# Patient Record
Sex: Male | Born: 1940
Health system: Southern US, Community
[De-identification: ages and names within clinical notes are randomized; demographics above are authoritative.]

## PROBLEM LIST (undated history)

## (undated) DIAGNOSIS — I209 Angina pectoris, unspecified: Secondary | ICD-10-CM

## (undated) DIAGNOSIS — J189 Pneumonia, unspecified organism: Secondary | ICD-10-CM

## (undated) DIAGNOSIS — F329 Major depressive disorder, single episode, unspecified: Secondary | ICD-10-CM

## (undated) DIAGNOSIS — F32A Depression, unspecified: Secondary | ICD-10-CM

## (undated) DIAGNOSIS — F419 Anxiety disorder, unspecified: Secondary | ICD-10-CM

## (undated) DIAGNOSIS — K219 Gastro-esophageal reflux disease without esophagitis: Secondary | ICD-10-CM

## (undated) DIAGNOSIS — I10 Essential (primary) hypertension: Secondary | ICD-10-CM

## (undated) DIAGNOSIS — E039 Hypothyroidism, unspecified: Secondary | ICD-10-CM

## (undated) DIAGNOSIS — M1990 Unspecified osteoarthritis, unspecified site: Secondary | ICD-10-CM

## (undated) DIAGNOSIS — R011 Cardiac murmur, unspecified: Secondary | ICD-10-CM

## (undated) HISTORY — PX: EYE SURGERY: SHX253

## (undated) HISTORY — PX: VASECTOMY: SHX75

---

## 2011-05-12 DIAGNOSIS — R5383 Other fatigue: Secondary | ICD-10-CM | POA: Diagnosis not present

## 2011-05-12 DIAGNOSIS — I10 Essential (primary) hypertension: Secondary | ICD-10-CM | POA: Diagnosis not present

## 2011-05-12 DIAGNOSIS — E039 Hypothyroidism, unspecified: Secondary | ICD-10-CM | POA: Diagnosis not present

## 2011-05-12 DIAGNOSIS — Z125 Encounter for screening for malignant neoplasm of prostate: Secondary | ICD-10-CM | POA: Diagnosis not present

## 2011-05-19 DIAGNOSIS — L723 Sebaceous cyst: Secondary | ICD-10-CM | POA: Diagnosis not present

## 2011-06-08 DIAGNOSIS — H2510 Age-related nuclear cataract, unspecified eye: Secondary | ICD-10-CM | POA: Diagnosis not present

## 2011-06-08 DIAGNOSIS — H40019 Open angle with borderline findings, low risk, unspecified eye: Secondary | ICD-10-CM | POA: Diagnosis not present

## 2011-06-08 DIAGNOSIS — Z9889 Other specified postprocedural states: Secondary | ICD-10-CM | POA: Diagnosis not present

## 2011-06-10 DIAGNOSIS — K219 Gastro-esophageal reflux disease without esophagitis: Secondary | ICD-10-CM | POA: Diagnosis not present

## 2011-06-10 DIAGNOSIS — I10 Essential (primary) hypertension: Secondary | ICD-10-CM | POA: Diagnosis not present

## 2011-06-10 DIAGNOSIS — F411 Generalized anxiety disorder: Secondary | ICD-10-CM | POA: Diagnosis not present

## 2011-06-10 DIAGNOSIS — E039 Hypothyroidism, unspecified: Secondary | ICD-10-CM | POA: Diagnosis not present

## 2011-06-13 DIAGNOSIS — I10 Essential (primary) hypertension: Secondary | ICD-10-CM | POA: Diagnosis not present

## 2011-06-13 DIAGNOSIS — Z6827 Body mass index (BMI) 27.0-27.9, adult: Secondary | ICD-10-CM | POA: Diagnosis not present

## 2011-06-13 DIAGNOSIS — L0291 Cutaneous abscess, unspecified: Secondary | ICD-10-CM | POA: Diagnosis not present

## 2011-06-13 DIAGNOSIS — L0390 Cellulitis, unspecified: Secondary | ICD-10-CM | POA: Diagnosis not present

## 2011-06-13 DIAGNOSIS — E039 Hypothyroidism, unspecified: Secondary | ICD-10-CM | POA: Diagnosis not present

## 2011-09-24 DIAGNOSIS — L570 Actinic keratosis: Secondary | ICD-10-CM | POA: Diagnosis not present

## 2011-09-24 DIAGNOSIS — D485 Neoplasm of uncertain behavior of skin: Secondary | ICD-10-CM | POA: Diagnosis not present

## 2011-09-24 DIAGNOSIS — D235 Other benign neoplasm of skin of trunk: Secondary | ICD-10-CM | POA: Diagnosis not present

## 2011-12-08 DIAGNOSIS — Z125 Encounter for screening for malignant neoplasm of prostate: Secondary | ICD-10-CM | POA: Diagnosis not present

## 2011-12-08 DIAGNOSIS — Z0000 Encounter for general adult medical examination without abnormal findings: Secondary | ICD-10-CM | POA: Diagnosis not present

## 2011-12-08 DIAGNOSIS — I10 Essential (primary) hypertension: Secondary | ICD-10-CM | POA: Diagnosis not present

## 2011-12-08 DIAGNOSIS — E039 Hypothyroidism, unspecified: Secondary | ICD-10-CM | POA: Diagnosis not present

## 2012-02-09 DIAGNOSIS — D1801 Hemangioma of skin and subcutaneous tissue: Secondary | ICD-10-CM | POA: Diagnosis not present

## 2012-02-09 DIAGNOSIS — L219 Seborrheic dermatitis, unspecified: Secondary | ICD-10-CM | POA: Diagnosis not present

## 2012-02-09 DIAGNOSIS — L821 Other seborrheic keratosis: Secondary | ICD-10-CM | POA: Diagnosis not present

## 2012-02-09 DIAGNOSIS — L910 Hypertrophic scar: Secondary | ICD-10-CM | POA: Diagnosis not present

## 2012-02-09 DIAGNOSIS — D239 Other benign neoplasm of skin, unspecified: Secondary | ICD-10-CM | POA: Diagnosis not present

## 2012-02-09 DIAGNOSIS — L570 Actinic keratosis: Secondary | ICD-10-CM | POA: Diagnosis not present

## 2012-02-25 DIAGNOSIS — H40019 Open angle with borderline findings, low risk, unspecified eye: Secondary | ICD-10-CM | POA: Diagnosis not present

## 2012-02-25 DIAGNOSIS — Z9889 Other specified postprocedural states: Secondary | ICD-10-CM | POA: Diagnosis not present

## 2012-02-25 DIAGNOSIS — Z961 Presence of intraocular lens: Secondary | ICD-10-CM | POA: Diagnosis not present

## 2012-02-25 DIAGNOSIS — H2510 Age-related nuclear cataract, unspecified eye: Secondary | ICD-10-CM | POA: Diagnosis not present

## 2012-03-08 DIAGNOSIS — E039 Hypothyroidism, unspecified: Secondary | ICD-10-CM | POA: Diagnosis not present

## 2012-03-08 DIAGNOSIS — I10 Essential (primary) hypertension: Secondary | ICD-10-CM | POA: Diagnosis not present

## 2012-03-08 DIAGNOSIS — Z6827 Body mass index (BMI) 27.0-27.9, adult: Secondary | ICD-10-CM | POA: Diagnosis not present

## 2012-03-08 DIAGNOSIS — J029 Acute pharyngitis, unspecified: Secondary | ICD-10-CM | POA: Diagnosis not present

## 2012-03-16 DIAGNOSIS — Z6827 Body mass index (BMI) 27.0-27.9, adult: Secondary | ICD-10-CM | POA: Diagnosis not present

## 2012-03-16 DIAGNOSIS — J392 Other diseases of pharynx: Secondary | ICD-10-CM | POA: Diagnosis not present

## 2012-03-16 DIAGNOSIS — F458 Other somatoform disorders: Secondary | ICD-10-CM | POA: Diagnosis not present

## 2012-03-16 DIAGNOSIS — K219 Gastro-esophageal reflux disease without esophagitis: Secondary | ICD-10-CM | POA: Diagnosis not present

## 2012-03-16 DIAGNOSIS — E039 Hypothyroidism, unspecified: Secondary | ICD-10-CM | POA: Diagnosis not present

## 2012-03-16 DIAGNOSIS — I10 Essential (primary) hypertension: Secondary | ICD-10-CM | POA: Diagnosis not present

## 2012-06-27 DIAGNOSIS — N529 Male erectile dysfunction, unspecified: Secondary | ICD-10-CM | POA: Diagnosis not present

## 2012-06-27 DIAGNOSIS — Z1331 Encounter for screening for depression: Secondary | ICD-10-CM | POA: Diagnosis not present

## 2012-06-27 DIAGNOSIS — F411 Generalized anxiety disorder: Secondary | ICD-10-CM | POA: Diagnosis not present

## 2012-06-27 DIAGNOSIS — Z9181 History of falling: Secondary | ICD-10-CM | POA: Diagnosis not present

## 2012-07-29 DIAGNOSIS — J309 Allergic rhinitis, unspecified: Secondary | ICD-10-CM | POA: Diagnosis not present

## 2012-07-29 DIAGNOSIS — E039 Hypothyroidism, unspecified: Secondary | ICD-10-CM | POA: Diagnosis not present

## 2012-07-29 DIAGNOSIS — Z23 Encounter for immunization: Secondary | ICD-10-CM | POA: Diagnosis not present

## 2012-07-29 DIAGNOSIS — I10 Essential (primary) hypertension: Secondary | ICD-10-CM | POA: Diagnosis not present

## 2012-07-29 DIAGNOSIS — Z6827 Body mass index (BMI) 27.0-27.9, adult: Secondary | ICD-10-CM | POA: Diagnosis not present

## 2012-10-17 DIAGNOSIS — H40019 Open angle with borderline findings, low risk, unspecified eye: Secondary | ICD-10-CM | POA: Diagnosis not present

## 2012-10-17 DIAGNOSIS — Z9889 Other specified postprocedural states: Secondary | ICD-10-CM | POA: Diagnosis not present

## 2012-10-17 DIAGNOSIS — H524 Presbyopia: Secondary | ICD-10-CM | POA: Diagnosis not present

## 2012-10-17 DIAGNOSIS — H2510 Age-related nuclear cataract, unspecified eye: Secondary | ICD-10-CM | POA: Diagnosis not present

## 2012-10-24 DIAGNOSIS — J309 Allergic rhinitis, unspecified: Secondary | ICD-10-CM | POA: Diagnosis not present

## 2012-10-24 DIAGNOSIS — Z6827 Body mass index (BMI) 27.0-27.9, adult: Secondary | ICD-10-CM | POA: Diagnosis not present

## 2012-10-24 DIAGNOSIS — J209 Acute bronchitis, unspecified: Secondary | ICD-10-CM | POA: Diagnosis not present

## 2012-10-24 DIAGNOSIS — K219 Gastro-esophageal reflux disease without esophagitis: Secondary | ICD-10-CM | POA: Diagnosis not present

## 2012-12-26 DIAGNOSIS — I10 Essential (primary) hypertension: Secondary | ICD-10-CM | POA: Diagnosis not present

## 2012-12-26 DIAGNOSIS — E039 Hypothyroidism, unspecified: Secondary | ICD-10-CM | POA: Diagnosis not present

## 2012-12-26 DIAGNOSIS — N529 Male erectile dysfunction, unspecified: Secondary | ICD-10-CM | POA: Diagnosis not present

## 2012-12-26 DIAGNOSIS — Z6827 Body mass index (BMI) 27.0-27.9, adult: Secondary | ICD-10-CM | POA: Diagnosis not present

## 2012-12-26 DIAGNOSIS — F411 Generalized anxiety disorder: Secondary | ICD-10-CM | POA: Diagnosis not present

## 2012-12-26 DIAGNOSIS — G4700 Insomnia, unspecified: Secondary | ICD-10-CM | POA: Diagnosis not present

## 2012-12-26 DIAGNOSIS — Z23 Encounter for immunization: Secondary | ICD-10-CM | POA: Diagnosis not present

## 2013-01-06 DIAGNOSIS — L981 Factitial dermatitis: Secondary | ICD-10-CM | POA: Diagnosis not present

## 2013-01-06 DIAGNOSIS — L570 Actinic keratosis: Secondary | ICD-10-CM | POA: Diagnosis not present

## 2013-01-06 DIAGNOSIS — L219 Seborrheic dermatitis, unspecified: Secondary | ICD-10-CM | POA: Diagnosis not present

## 2013-02-07 DIAGNOSIS — L821 Other seborrheic keratosis: Secondary | ICD-10-CM | POA: Diagnosis not present

## 2013-02-07 DIAGNOSIS — L578 Other skin changes due to chronic exposure to nonionizing radiation: Secondary | ICD-10-CM | POA: Diagnosis not present

## 2013-05-11 DIAGNOSIS — H33009 Unspecified retinal detachment with retinal break, unspecified eye: Secondary | ICD-10-CM | POA: Diagnosis not present

## 2013-05-11 DIAGNOSIS — Z9889 Other specified postprocedural states: Secondary | ICD-10-CM | POA: Diagnosis not present

## 2013-05-11 DIAGNOSIS — H2510 Age-related nuclear cataract, unspecified eye: Secondary | ICD-10-CM | POA: Diagnosis not present

## 2013-05-11 DIAGNOSIS — H40019 Open angle with borderline findings, low risk, unspecified eye: Secondary | ICD-10-CM | POA: Diagnosis not present

## 2013-05-17 DIAGNOSIS — L820 Inflamed seborrheic keratosis: Secondary | ICD-10-CM | POA: Diagnosis not present

## 2013-06-28 DIAGNOSIS — Z0000 Encounter for general adult medical examination without abnormal findings: Secondary | ICD-10-CM | POA: Diagnosis not present

## 2013-06-28 DIAGNOSIS — I10 Essential (primary) hypertension: Secondary | ICD-10-CM | POA: Diagnosis not present

## 2013-06-28 DIAGNOSIS — E039 Hypothyroidism, unspecified: Secondary | ICD-10-CM | POA: Diagnosis not present

## 2013-06-28 DIAGNOSIS — Z125 Encounter for screening for malignant neoplasm of prostate: Secondary | ICD-10-CM | POA: Diagnosis not present

## 2013-08-15 DIAGNOSIS — Z1211 Encounter for screening for malignant neoplasm of colon: Secondary | ICD-10-CM | POA: Diagnosis not present

## 2013-09-21 DIAGNOSIS — H33009 Unspecified retinal detachment with retinal break, unspecified eye: Secondary | ICD-10-CM | POA: Diagnosis not present

## 2013-09-21 DIAGNOSIS — H40019 Open angle with borderline findings, low risk, unspecified eye: Secondary | ICD-10-CM | POA: Diagnosis not present

## 2013-09-21 DIAGNOSIS — Z9889 Other specified postprocedural states: Secondary | ICD-10-CM | POA: Diagnosis not present

## 2013-09-21 DIAGNOSIS — H2510 Age-related nuclear cataract, unspecified eye: Secondary | ICD-10-CM | POA: Diagnosis not present

## 2013-10-23 DIAGNOSIS — IMO0002 Reserved for concepts with insufficient information to code with codable children: Secondary | ICD-10-CM | POA: Diagnosis not present

## 2013-10-23 DIAGNOSIS — M1710 Unilateral primary osteoarthritis, unspecified knee: Secondary | ICD-10-CM | POA: Diagnosis not present

## 2013-10-23 DIAGNOSIS — M25569 Pain in unspecified knee: Secondary | ICD-10-CM | POA: Diagnosis not present

## 2013-12-25 DIAGNOSIS — IMO0002 Reserved for concepts with insufficient information to code with codable children: Secondary | ICD-10-CM | POA: Diagnosis not present

## 2013-12-28 DIAGNOSIS — M1710 Unilateral primary osteoarthritis, unspecified knee: Secondary | ICD-10-CM | POA: Diagnosis not present

## 2013-12-28 DIAGNOSIS — M7120 Synovial cyst of popliteal space [Baker], unspecified knee: Secondary | ICD-10-CM | POA: Diagnosis not present

## 2013-12-28 DIAGNOSIS — M25469 Effusion, unspecified knee: Secondary | ICD-10-CM | POA: Diagnosis not present

## 2013-12-28 DIAGNOSIS — M8430XA Stress fracture, unspecified site, initial encounter for fracture: Secondary | ICD-10-CM | POA: Diagnosis not present

## 2013-12-28 DIAGNOSIS — M25569 Pain in unspecified knee: Secondary | ICD-10-CM | POA: Diagnosis not present

## 2013-12-28 DIAGNOSIS — IMO0002 Reserved for concepts with insufficient information to code with codable children: Secondary | ICD-10-CM | POA: Diagnosis not present

## 2013-12-28 DIAGNOSIS — M23329 Other meniscus derangements, posterior horn of medial meniscus, unspecified knee: Secondary | ICD-10-CM | POA: Diagnosis not present

## 2013-12-28 DIAGNOSIS — M84353A Stress fracture, unspecified femur, initial encounter for fracture: Secondary | ICD-10-CM | POA: Diagnosis not present

## 2014-01-01 DIAGNOSIS — IMO0002 Reserved for concepts with insufficient information to code with codable children: Secondary | ICD-10-CM | POA: Diagnosis not present

## 2014-01-10 DIAGNOSIS — E039 Hypothyroidism, unspecified: Secondary | ICD-10-CM | POA: Diagnosis not present

## 2014-01-10 DIAGNOSIS — J309 Allergic rhinitis, unspecified: Secondary | ICD-10-CM | POA: Diagnosis not present

## 2014-01-10 DIAGNOSIS — F411 Generalized anxiety disorder: Secondary | ICD-10-CM | POA: Diagnosis not present

## 2014-01-10 DIAGNOSIS — I10 Essential (primary) hypertension: Secondary | ICD-10-CM | POA: Diagnosis not present

## 2014-02-06 DIAGNOSIS — M1712 Unilateral primary osteoarthritis, left knee: Secondary | ICD-10-CM | POA: Diagnosis not present

## 2014-02-13 DIAGNOSIS — D1801 Hemangioma of skin and subcutaneous tissue: Secondary | ICD-10-CM | POA: Diagnosis not present

## 2014-02-13 DIAGNOSIS — D2272 Melanocytic nevi of left lower limb, including hip: Secondary | ICD-10-CM | POA: Diagnosis not present

## 2014-02-13 DIAGNOSIS — L812 Freckles: Secondary | ICD-10-CM | POA: Diagnosis not present

## 2014-02-13 DIAGNOSIS — D225 Melanocytic nevi of trunk: Secondary | ICD-10-CM | POA: Diagnosis not present

## 2014-02-13 DIAGNOSIS — I788 Other diseases of capillaries: Secondary | ICD-10-CM | POA: Diagnosis not present

## 2014-02-13 DIAGNOSIS — L570 Actinic keratosis: Secondary | ICD-10-CM | POA: Diagnosis not present

## 2014-02-13 DIAGNOSIS — D2372 Other benign neoplasm of skin of left lower limb, including hip: Secondary | ICD-10-CM | POA: Diagnosis not present

## 2014-02-13 DIAGNOSIS — L821 Other seborrheic keratosis: Secondary | ICD-10-CM | POA: Diagnosis not present

## 2014-02-20 DIAGNOSIS — Z23 Encounter for immunization: Secondary | ICD-10-CM | POA: Diagnosis not present

## 2014-02-27 DIAGNOSIS — S83242A Other tear of medial meniscus, current injury, left knee, initial encounter: Secondary | ICD-10-CM | POA: Diagnosis not present

## 2014-03-04 HISTORY — PX: KNEE ARTHROSCOPY: SUR90

## 2014-03-14 DIAGNOSIS — X58XXXA Exposure to other specified factors, initial encounter: Secondary | ICD-10-CM | POA: Diagnosis not present

## 2014-03-14 DIAGNOSIS — S83242A Other tear of medial meniscus, current injury, left knee, initial encounter: Secondary | ICD-10-CM | POA: Diagnosis not present

## 2014-03-14 DIAGNOSIS — M6752 Plica syndrome, left knee: Secondary | ICD-10-CM | POA: Diagnosis not present

## 2014-03-14 DIAGNOSIS — Y929 Unspecified place or not applicable: Secondary | ICD-10-CM | POA: Diagnosis not present

## 2014-03-14 DIAGNOSIS — M958 Other specified acquired deformities of musculoskeletal system: Secondary | ICD-10-CM | POA: Diagnosis not present

## 2014-03-14 DIAGNOSIS — M93262 Osteochondritis dissecans, left knee: Secondary | ICD-10-CM | POA: Diagnosis not present

## 2014-03-14 DIAGNOSIS — G8918 Other acute postprocedural pain: Secondary | ICD-10-CM | POA: Diagnosis not present

## 2014-03-14 DIAGNOSIS — S83232A Complex tear of medial meniscus, current injury, left knee, initial encounter: Secondary | ICD-10-CM | POA: Diagnosis not present

## 2014-03-26 DIAGNOSIS — H2511 Age-related nuclear cataract, right eye: Secondary | ICD-10-CM | POA: Diagnosis not present

## 2014-03-26 DIAGNOSIS — H5203 Hypermetropia, bilateral: Secondary | ICD-10-CM | POA: Diagnosis not present

## 2014-03-26 DIAGNOSIS — Z961 Presence of intraocular lens: Secondary | ICD-10-CM | POA: Diagnosis not present

## 2014-03-26 DIAGNOSIS — H40013 Open angle with borderline findings, low risk, bilateral: Secondary | ICD-10-CM | POA: Diagnosis not present

## 2014-03-26 DIAGNOSIS — Z9889 Other specified postprocedural states: Secondary | ICD-10-CM | POA: Diagnosis not present

## 2014-03-27 DIAGNOSIS — M6281 Muscle weakness (generalized): Secondary | ICD-10-CM | POA: Diagnosis not present

## 2014-03-27 DIAGNOSIS — M25562 Pain in left knee: Secondary | ICD-10-CM | POA: Diagnosis not present

## 2014-03-30 DIAGNOSIS — M25562 Pain in left knee: Secondary | ICD-10-CM | POA: Diagnosis not present

## 2014-03-30 DIAGNOSIS — M6281 Muscle weakness (generalized): Secondary | ICD-10-CM | POA: Diagnosis not present

## 2014-04-02 DIAGNOSIS — M6281 Muscle weakness (generalized): Secondary | ICD-10-CM | POA: Diagnosis not present

## 2014-04-02 DIAGNOSIS — M25562 Pain in left knee: Secondary | ICD-10-CM | POA: Diagnosis not present

## 2014-04-05 DIAGNOSIS — M6281 Muscle weakness (generalized): Secondary | ICD-10-CM | POA: Diagnosis not present

## 2014-04-05 DIAGNOSIS — M25562 Pain in left knee: Secondary | ICD-10-CM | POA: Diagnosis not present

## 2014-04-09 DIAGNOSIS — M6281 Muscle weakness (generalized): Secondary | ICD-10-CM | POA: Diagnosis not present

## 2014-04-09 DIAGNOSIS — M25562 Pain in left knee: Secondary | ICD-10-CM | POA: Diagnosis not present

## 2014-04-20 DIAGNOSIS — M6281 Muscle weakness (generalized): Secondary | ICD-10-CM | POA: Diagnosis not present

## 2014-04-20 DIAGNOSIS — M25562 Pain in left knee: Secondary | ICD-10-CM | POA: Diagnosis not present

## 2014-04-24 DIAGNOSIS — M25562 Pain in left knee: Secondary | ICD-10-CM | POA: Diagnosis not present

## 2014-04-24 DIAGNOSIS — M6281 Muscle weakness (generalized): Secondary | ICD-10-CM | POA: Diagnosis not present

## 2014-04-26 DIAGNOSIS — M6281 Muscle weakness (generalized): Secondary | ICD-10-CM | POA: Diagnosis not present

## 2014-04-26 DIAGNOSIS — M25562 Pain in left knee: Secondary | ICD-10-CM | POA: Diagnosis not present

## 2014-05-01 DIAGNOSIS — M6281 Muscle weakness (generalized): Secondary | ICD-10-CM | POA: Diagnosis not present

## 2014-05-01 DIAGNOSIS — M25562 Pain in left knee: Secondary | ICD-10-CM | POA: Diagnosis not present

## 2014-05-08 DIAGNOSIS — M6281 Muscle weakness (generalized): Secondary | ICD-10-CM | POA: Diagnosis not present

## 2014-05-08 DIAGNOSIS — M25562 Pain in left knee: Secondary | ICD-10-CM | POA: Diagnosis not present

## 2014-05-10 DIAGNOSIS — M25562 Pain in left knee: Secondary | ICD-10-CM | POA: Diagnosis not present

## 2014-05-10 DIAGNOSIS — M6281 Muscle weakness (generalized): Secondary | ICD-10-CM | POA: Diagnosis not present

## 2014-05-14 DIAGNOSIS — M6281 Muscle weakness (generalized): Secondary | ICD-10-CM | POA: Diagnosis not present

## 2014-05-14 DIAGNOSIS — M25562 Pain in left knee: Secondary | ICD-10-CM | POA: Diagnosis not present

## 2014-05-16 DIAGNOSIS — M25562 Pain in left knee: Secondary | ICD-10-CM | POA: Diagnosis not present

## 2014-05-16 DIAGNOSIS — M6281 Muscle weakness (generalized): Secondary | ICD-10-CM | POA: Diagnosis not present

## 2014-05-22 DIAGNOSIS — M6281 Muscle weakness (generalized): Secondary | ICD-10-CM | POA: Diagnosis not present

## 2014-05-22 DIAGNOSIS — M25562 Pain in left knee: Secondary | ICD-10-CM | POA: Diagnosis not present

## 2014-05-30 DIAGNOSIS — M6281 Muscle weakness (generalized): Secondary | ICD-10-CM | POA: Diagnosis not present

## 2014-05-30 DIAGNOSIS — M25562 Pain in left knee: Secondary | ICD-10-CM | POA: Diagnosis not present

## 2014-06-01 DIAGNOSIS — M25562 Pain in left knee: Secondary | ICD-10-CM | POA: Diagnosis not present

## 2014-06-01 DIAGNOSIS — M6281 Muscle weakness (generalized): Secondary | ICD-10-CM | POA: Diagnosis not present

## 2014-06-04 DIAGNOSIS — M25562 Pain in left knee: Secondary | ICD-10-CM | POA: Diagnosis not present

## 2014-06-04 DIAGNOSIS — M6281 Muscle weakness (generalized): Secondary | ICD-10-CM | POA: Diagnosis not present

## 2014-06-05 DIAGNOSIS — Z1211 Encounter for screening for malignant neoplasm of colon: Secondary | ICD-10-CM | POA: Diagnosis not present

## 2014-06-05 DIAGNOSIS — Z8371 Family history of colonic polyps: Secondary | ICD-10-CM | POA: Diagnosis not present

## 2014-06-11 DIAGNOSIS — M25562 Pain in left knee: Secondary | ICD-10-CM | POA: Diagnosis not present

## 2014-06-11 DIAGNOSIS — M6281 Muscle weakness (generalized): Secondary | ICD-10-CM | POA: Diagnosis not present

## 2014-06-15 DIAGNOSIS — M25562 Pain in left knee: Secondary | ICD-10-CM | POA: Diagnosis not present

## 2014-06-15 DIAGNOSIS — M6281 Muscle weakness (generalized): Secondary | ICD-10-CM | POA: Diagnosis not present

## 2014-06-18 DIAGNOSIS — M25562 Pain in left knee: Secondary | ICD-10-CM | POA: Diagnosis not present

## 2014-06-18 DIAGNOSIS — M6281 Muscle weakness (generalized): Secondary | ICD-10-CM | POA: Diagnosis not present

## 2014-06-22 DIAGNOSIS — M6281 Muscle weakness (generalized): Secondary | ICD-10-CM | POA: Diagnosis not present

## 2014-06-22 DIAGNOSIS — M25562 Pain in left knee: Secondary | ICD-10-CM | POA: Diagnosis not present

## 2014-06-27 DIAGNOSIS — M25562 Pain in left knee: Secondary | ICD-10-CM | POA: Diagnosis not present

## 2014-06-27 DIAGNOSIS — M6281 Muscle weakness (generalized): Secondary | ICD-10-CM | POA: Diagnosis not present

## 2014-06-29 DIAGNOSIS — M6281 Muscle weakness (generalized): Secondary | ICD-10-CM | POA: Diagnosis not present

## 2014-06-29 DIAGNOSIS — M25562 Pain in left knee: Secondary | ICD-10-CM | POA: Diagnosis not present

## 2014-07-02 DIAGNOSIS — M6281 Muscle weakness (generalized): Secondary | ICD-10-CM | POA: Diagnosis not present

## 2014-07-02 DIAGNOSIS — M25562 Pain in left knee: Secondary | ICD-10-CM | POA: Diagnosis not present

## 2014-07-05 DIAGNOSIS — M25562 Pain in left knee: Secondary | ICD-10-CM | POA: Diagnosis not present

## 2014-07-05 DIAGNOSIS — M6281 Muscle weakness (generalized): Secondary | ICD-10-CM | POA: Diagnosis not present

## 2014-07-09 DIAGNOSIS — M6281 Muscle weakness (generalized): Secondary | ICD-10-CM | POA: Diagnosis not present

## 2014-07-09 DIAGNOSIS — M25562 Pain in left knee: Secondary | ICD-10-CM | POA: Diagnosis not present

## 2014-07-11 DIAGNOSIS — Z1389 Encounter for screening for other disorder: Secondary | ICD-10-CM | POA: Diagnosis not present

## 2014-07-11 DIAGNOSIS — Z9181 History of falling: Secondary | ICD-10-CM | POA: Diagnosis not present

## 2014-07-11 DIAGNOSIS — F419 Anxiety disorder, unspecified: Secondary | ICD-10-CM | POA: Diagnosis not present

## 2014-07-11 DIAGNOSIS — E039 Hypothyroidism, unspecified: Secondary | ICD-10-CM | POA: Diagnosis not present

## 2014-07-11 DIAGNOSIS — J309 Allergic rhinitis, unspecified: Secondary | ICD-10-CM | POA: Diagnosis not present

## 2014-07-11 DIAGNOSIS — G4700 Insomnia, unspecified: Secondary | ICD-10-CM | POA: Diagnosis not present

## 2014-07-11 DIAGNOSIS — N529 Male erectile dysfunction, unspecified: Secondary | ICD-10-CM | POA: Diagnosis not present

## 2014-07-11 DIAGNOSIS — N400 Enlarged prostate without lower urinary tract symptoms: Secondary | ICD-10-CM | POA: Diagnosis not present

## 2014-07-11 DIAGNOSIS — I10 Essential (primary) hypertension: Secondary | ICD-10-CM | POA: Diagnosis not present

## 2014-07-11 DIAGNOSIS — Z6828 Body mass index (BMI) 28.0-28.9, adult: Secondary | ICD-10-CM | POA: Diagnosis not present

## 2014-07-12 DIAGNOSIS — M6281 Muscle weakness (generalized): Secondary | ICD-10-CM | POA: Diagnosis not present

## 2014-07-12 DIAGNOSIS — M25562 Pain in left knee: Secondary | ICD-10-CM | POA: Diagnosis not present

## 2014-07-20 DIAGNOSIS — M6281 Muscle weakness (generalized): Secondary | ICD-10-CM | POA: Diagnosis not present

## 2014-07-20 DIAGNOSIS — M25562 Pain in left knee: Secondary | ICD-10-CM | POA: Diagnosis not present

## 2014-08-06 DIAGNOSIS — F419 Anxiety disorder, unspecified: Secondary | ICD-10-CM | POA: Diagnosis not present

## 2014-08-06 DIAGNOSIS — I10 Essential (primary) hypertension: Secondary | ICD-10-CM | POA: Diagnosis not present

## 2014-08-06 DIAGNOSIS — N529 Male erectile dysfunction, unspecified: Secondary | ICD-10-CM | POA: Diagnosis not present

## 2014-08-06 DIAGNOSIS — E039 Hypothyroidism, unspecified: Secondary | ICD-10-CM | POA: Diagnosis not present

## 2014-08-06 DIAGNOSIS — Z800 Family history of malignant neoplasm of digestive organs: Secondary | ICD-10-CM | POA: Diagnosis not present

## 2014-08-06 DIAGNOSIS — D124 Benign neoplasm of descending colon: Secondary | ICD-10-CM | POA: Diagnosis not present

## 2014-08-06 DIAGNOSIS — K5730 Diverticulosis of large intestine without perforation or abscess without bleeding: Secondary | ICD-10-CM | POA: Diagnosis not present

## 2014-08-06 DIAGNOSIS — Z86010 Personal history of colonic polyps: Secondary | ICD-10-CM | POA: Diagnosis not present

## 2014-08-06 DIAGNOSIS — K648 Other hemorrhoids: Secondary | ICD-10-CM | POA: Diagnosis not present

## 2014-08-06 DIAGNOSIS — K635 Polyp of colon: Secondary | ICD-10-CM | POA: Diagnosis not present

## 2014-08-06 DIAGNOSIS — Z8371 Family history of colonic polyps: Secondary | ICD-10-CM | POA: Diagnosis not present

## 2014-08-06 DIAGNOSIS — K589 Irritable bowel syndrome without diarrhea: Secondary | ICD-10-CM | POA: Diagnosis not present

## 2014-08-06 DIAGNOSIS — Z1211 Encounter for screening for malignant neoplasm of colon: Secondary | ICD-10-CM | POA: Diagnosis not present

## 2014-08-06 DIAGNOSIS — N400 Enlarged prostate without lower urinary tract symptoms: Secondary | ICD-10-CM | POA: Diagnosis not present

## 2014-08-06 DIAGNOSIS — K219 Gastro-esophageal reflux disease without esophagitis: Secondary | ICD-10-CM | POA: Diagnosis not present

## 2014-08-06 DIAGNOSIS — G4700 Insomnia, unspecified: Secondary | ICD-10-CM | POA: Diagnosis not present

## 2014-08-09 DIAGNOSIS — Z9889 Other specified postprocedural states: Secondary | ICD-10-CM | POA: Diagnosis not present

## 2014-08-21 DIAGNOSIS — R222 Localized swelling, mass and lump, trunk: Secondary | ICD-10-CM | POA: Diagnosis not present

## 2014-08-21 DIAGNOSIS — L723 Sebaceous cyst: Secondary | ICD-10-CM | POA: Diagnosis not present

## 2014-08-21 DIAGNOSIS — M7989 Other specified soft tissue disorders: Secondary | ICD-10-CM | POA: Diagnosis not present

## 2014-08-23 DIAGNOSIS — H40013 Open angle with borderline findings, low risk, bilateral: Secondary | ICD-10-CM | POA: Diagnosis not present

## 2014-09-03 DIAGNOSIS — Z09 Encounter for follow-up examination after completed treatment for conditions other than malignant neoplasm: Secondary | ICD-10-CM | POA: Diagnosis not present

## 2014-09-03 DIAGNOSIS — L723 Sebaceous cyst: Secondary | ICD-10-CM | POA: Diagnosis not present

## 2014-09-13 DIAGNOSIS — M7741 Metatarsalgia, right foot: Secondary | ICD-10-CM | POA: Diagnosis not present

## 2014-09-13 DIAGNOSIS — L603 Nail dystrophy: Secondary | ICD-10-CM | POA: Diagnosis not present

## 2014-09-13 DIAGNOSIS — M719 Bursopathy, unspecified: Secondary | ICD-10-CM | POA: Diagnosis not present

## 2014-10-02 DIAGNOSIS — M9903 Segmental and somatic dysfunction of lumbar region: Secondary | ICD-10-CM | POA: Diagnosis not present

## 2014-10-02 DIAGNOSIS — M9905 Segmental and somatic dysfunction of pelvic region: Secondary | ICD-10-CM | POA: Diagnosis not present

## 2014-10-02 DIAGNOSIS — M5416 Radiculopathy, lumbar region: Secondary | ICD-10-CM | POA: Diagnosis not present

## 2014-10-02 DIAGNOSIS — M9902 Segmental and somatic dysfunction of thoracic region: Secondary | ICD-10-CM | POA: Diagnosis not present

## 2014-10-04 DIAGNOSIS — M9903 Segmental and somatic dysfunction of lumbar region: Secondary | ICD-10-CM | POA: Diagnosis not present

## 2014-10-04 DIAGNOSIS — M9905 Segmental and somatic dysfunction of pelvic region: Secondary | ICD-10-CM | POA: Diagnosis not present

## 2014-10-04 DIAGNOSIS — M7741 Metatarsalgia, right foot: Secondary | ICD-10-CM | POA: Diagnosis not present

## 2014-10-04 DIAGNOSIS — M5416 Radiculopathy, lumbar region: Secondary | ICD-10-CM | POA: Diagnosis not present

## 2014-10-04 DIAGNOSIS — M9902 Segmental and somatic dysfunction of thoracic region: Secondary | ICD-10-CM | POA: Diagnosis not present

## 2014-10-04 DIAGNOSIS — M719 Bursopathy, unspecified: Secondary | ICD-10-CM | POA: Diagnosis not present

## 2014-10-11 DIAGNOSIS — M9903 Segmental and somatic dysfunction of lumbar region: Secondary | ICD-10-CM | POA: Diagnosis not present

## 2014-10-11 DIAGNOSIS — M9902 Segmental and somatic dysfunction of thoracic region: Secondary | ICD-10-CM | POA: Diagnosis not present

## 2014-10-11 DIAGNOSIS — M5416 Radiculopathy, lumbar region: Secondary | ICD-10-CM | POA: Diagnosis not present

## 2014-10-11 DIAGNOSIS — M9905 Segmental and somatic dysfunction of pelvic region: Secondary | ICD-10-CM | POA: Diagnosis not present

## 2014-10-25 DIAGNOSIS — Z9889 Other specified postprocedural states: Secondary | ICD-10-CM | POA: Diagnosis not present

## 2014-10-25 DIAGNOSIS — H2511 Age-related nuclear cataract, right eye: Secondary | ICD-10-CM | POA: Diagnosis not present

## 2014-10-25 DIAGNOSIS — H33001 Unspecified retinal detachment with retinal break, right eye: Secondary | ICD-10-CM | POA: Diagnosis not present

## 2014-10-25 DIAGNOSIS — H40013 Open angle with borderline findings, low risk, bilateral: Secondary | ICD-10-CM | POA: Diagnosis not present

## 2014-11-13 DIAGNOSIS — M7741 Metatarsalgia, right foot: Secondary | ICD-10-CM | POA: Diagnosis not present

## 2014-11-19 DIAGNOSIS — Z6828 Body mass index (BMI) 28.0-28.9, adult: Secondary | ICD-10-CM | POA: Diagnosis not present

## 2014-11-19 DIAGNOSIS — I10 Essential (primary) hypertension: Secondary | ICD-10-CM | POA: Diagnosis not present

## 2014-11-19 DIAGNOSIS — F419 Anxiety disorder, unspecified: Secondary | ICD-10-CM | POA: Diagnosis not present

## 2014-11-19 DIAGNOSIS — N400 Enlarged prostate without lower urinary tract symptoms: Secondary | ICD-10-CM | POA: Diagnosis not present

## 2014-11-19 DIAGNOSIS — J309 Allergic rhinitis, unspecified: Secondary | ICD-10-CM | POA: Diagnosis not present

## 2014-11-19 DIAGNOSIS — K219 Gastro-esophageal reflux disease without esophagitis: Secondary | ICD-10-CM | POA: Diagnosis not present

## 2014-11-19 DIAGNOSIS — N529 Male erectile dysfunction, unspecified: Secondary | ICD-10-CM | POA: Diagnosis not present

## 2014-11-19 DIAGNOSIS — G4700 Insomnia, unspecified: Secondary | ICD-10-CM | POA: Diagnosis not present

## 2014-11-22 DIAGNOSIS — M1712 Unilateral primary osteoarthritis, left knee: Secondary | ICD-10-CM | POA: Diagnosis not present

## 2014-11-22 DIAGNOSIS — M179 Osteoarthritis of knee, unspecified: Secondary | ICD-10-CM | POA: Diagnosis not present

## 2014-11-22 DIAGNOSIS — M25562 Pain in left knee: Secondary | ICD-10-CM | POA: Diagnosis not present

## 2014-11-22 DIAGNOSIS — Z9889 Other specified postprocedural states: Secondary | ICD-10-CM | POA: Diagnosis not present

## 2014-12-25 DIAGNOSIS — N400 Enlarged prostate without lower urinary tract symptoms: Secondary | ICD-10-CM | POA: Diagnosis not present

## 2014-12-25 DIAGNOSIS — F419 Anxiety disorder, unspecified: Secondary | ICD-10-CM | POA: Diagnosis not present

## 2014-12-25 DIAGNOSIS — I10 Essential (primary) hypertension: Secondary | ICD-10-CM | POA: Diagnosis not present

## 2014-12-25 DIAGNOSIS — Z01810 Encounter for preprocedural cardiovascular examination: Secondary | ICD-10-CM | POA: Diagnosis not present

## 2014-12-25 DIAGNOSIS — N529 Male erectile dysfunction, unspecified: Secondary | ICD-10-CM | POA: Diagnosis not present

## 2014-12-25 DIAGNOSIS — J309 Allergic rhinitis, unspecified: Secondary | ICD-10-CM | POA: Diagnosis not present

## 2014-12-25 DIAGNOSIS — Z6828 Body mass index (BMI) 28.0-28.9, adult: Secondary | ICD-10-CM | POA: Diagnosis not present

## 2014-12-25 DIAGNOSIS — E039 Hypothyroidism, unspecified: Secondary | ICD-10-CM | POA: Diagnosis not present

## 2014-12-25 DIAGNOSIS — G4700 Insomnia, unspecified: Secondary | ICD-10-CM | POA: Diagnosis not present

## 2014-12-28 DIAGNOSIS — L570 Actinic keratosis: Secondary | ICD-10-CM | POA: Diagnosis not present

## 2015-01-01 ENCOUNTER — Other Ambulatory Visit: Payer: Self-pay | Admitting: Orthopedic Surgery

## 2015-01-01 DIAGNOSIS — M25562 Pain in left knee: Secondary | ICD-10-CM

## 2015-01-08 ENCOUNTER — Ambulatory Visit
Admission: RE | Admit: 2015-01-08 | Discharge: 2015-01-08 | Disposition: A | Discharge status: Home / Self Care | Payer: Medicare Other | Source: Ambulatory Visit | Attending: Orthopedic Surgery | Admitting: Orthopedic Surgery

## 2015-01-08 DIAGNOSIS — M25562 Pain in left knee: Secondary | ICD-10-CM

## 2015-01-08 DIAGNOSIS — M1612 Unilateral primary osteoarthritis, left hip: Secondary | ICD-10-CM | POA: Diagnosis not present

## 2015-01-21 ENCOUNTER — Other Ambulatory Visit: Payer: Self-pay | Admitting: Orthopedic Surgery

## 2015-02-07 DIAGNOSIS — M1712 Unilateral primary osteoarthritis, left knee: Secondary | ICD-10-CM | POA: Diagnosis not present

## 2015-02-22 ENCOUNTER — Encounter (HOSPITAL_COMMUNITY)
Admission: RE | Admit: 2015-02-22 | Discharge: 2015-02-22 | Disposition: A | Discharge status: Home / Self Care | Payer: Medicare Other | Source: Ambulatory Visit | Attending: Orthopedic Surgery | Admitting: Orthopedic Surgery

## 2015-02-22 ENCOUNTER — Encounter (HOSPITAL_COMMUNITY): Payer: Self-pay

## 2015-02-22 DIAGNOSIS — Z0389 Encounter for observation for other suspected diseases and conditions ruled out: Secondary | ICD-10-CM | POA: Diagnosis not present

## 2015-02-22 DIAGNOSIS — M1712 Unilateral primary osteoarthritis, left knee: Secondary | ICD-10-CM | POA: Diagnosis not present

## 2015-02-22 DIAGNOSIS — Z01812 Encounter for preprocedural laboratory examination: Secondary | ICD-10-CM | POA: Diagnosis not present

## 2015-02-22 DIAGNOSIS — Z01818 Encounter for other preprocedural examination: Secondary | ICD-10-CM | POA: Diagnosis not present

## 2015-02-22 HISTORY — DX: Essential (primary) hypertension: I10

## 2015-02-22 HISTORY — DX: Angina pectoris, unspecified: I20.9

## 2015-02-22 HISTORY — DX: Pneumonia, unspecified organism: J18.9

## 2015-02-22 HISTORY — DX: Unspecified osteoarthritis, unspecified site: M19.90

## 2015-02-22 HISTORY — DX: Anxiety disorder, unspecified: F41.9

## 2015-02-22 HISTORY — DX: Cardiac murmur, unspecified: R01.1

## 2015-02-22 HISTORY — DX: Gastro-esophageal reflux disease without esophagitis: K21.9

## 2015-02-22 HISTORY — DX: Hypothyroidism, unspecified: E03.9

## 2015-02-22 LAB — COMPREHENSIVE METABOLIC PANEL
ALBUMIN: 4.4 g/dL (ref 3.5–5.0)
ALK PHOS: 58 U/L (ref 38–126)
ALT: 36 U/L (ref 17–63)
ANION GAP: 9 (ref 5–15)
AST: 30 U/L (ref 15–41)
BUN: 16 mg/dL (ref 6–20)
CALCIUM: 9.2 mg/dL (ref 8.9–10.3)
CO2: 26 mmol/L (ref 22–32)
Chloride: 102 mmol/L (ref 101–111)
Creatinine, Ser: 1.22 mg/dL (ref 0.61–1.24)
GFR calc Af Amer: >60 mL/min (ref >60)
GFR calc non Af Amer: 57 mL/min — ABNORMAL LOW (ref >60)
GLUCOSE: 82 mg/dL (ref 65–99)
Potassium: 4.4 mmol/L (ref 3.5–5.1)
SODIUM: 137 mmol/L (ref 135–145)
Total Bilirubin: 1.1 mg/dL (ref 0.3–1.2)
Total Protein: 7.2 g/dL (ref 6.5–8.1)

## 2015-02-22 LAB — URINALYSIS, ROUTINE W REFLEX MICROSCOPIC
GLUCOSE, UA: NEGATIVE mg/dL
Hgb urine dipstick: NEGATIVE
Ketones, ur: 15 mg/dL — AB
LEUKOCYTES UA: NEGATIVE
Nitrite: NEGATIVE
PROTEIN: NEGATIVE mg/dL
Specific Gravity, Urine: 1.026 (ref 1.005–1.030)
UROBILINOGEN UA: 1 mg/dL (ref 0.0–1.0)
pH: 5 (ref 5.0–8.0)

## 2015-02-22 LAB — CBC WITH DIFFERENTIAL/PLATELET
BASOS ABS: 0 10*3/uL (ref 0.0–0.1)
BASOS PCT: 0 %
EOS ABS: 0.1 10*3/uL (ref 0.0–0.7)
Eosinophils Relative: 1 %
HCT: 46.7 % (ref 39.0–52.0)
HEMOGLOBIN: 16.2 g/dL (ref 13.0–17.0)
Lymphocytes Relative: 35 %
Lymphs Abs: 3.4 10*3/uL (ref 0.7–4.0)
MCH: 32.4 pg (ref 26.0–34.0)
MCHC: 34.7 g/dL (ref 30.0–36.0)
MCV: 93.4 fL (ref 78.0–100.0)
MONOS PCT: 7 %
Monocytes Absolute: 0.7 10*3/uL (ref 0.1–1.0)
NEUTROS PCT: 57 %
Neutro Abs: 5.6 10*3/uL (ref 1.7–7.7)
Platelets: 229 10*3/uL (ref 150–400)
RBC: 5 MIL/uL (ref 4.22–5.81)
RDW: 12.4 % (ref 11.5–15.5)
WBC: 9.8 10*3/uL (ref 4.0–10.5)

## 2015-02-22 LAB — PROTIME-INR
INR: 1.03 (ref 0.00–1.49)
Prothrombin Time: 13.7 seconds (ref 11.6–15.2)

## 2015-02-22 LAB — SURGICAL PCR SCREEN
MRSA, PCR: NEGATIVE
STAPHYLOCOCCUS AUREUS: NEGATIVE

## 2015-02-22 LAB — APTT: APTT: 29 s (ref 24–37)

## 2015-02-22 NOTE — Pre-Procedure Instructions (Addendum)
[facility]  02/22/2015      [facility] - [place], [place] - [address] [place] [address] Phone: [phone] Fax: [phone]  [place] [place] [address] [place] [address] Phone: [phone] Fax: [phone]    Your procedure is scheduled on 03/04/15.  Report to [facility] short stay admitting at 900 A.M.  Call this number if you have problems the morning of surgery:  [phone]   Remember:  Do not eat food or drink liquids after midnight.  Take these medicines the morning of surgery with A SIP OF WATER amlodipine, lexapro, levothyroxine, prilosec   STOP all herbel meds, nsaids (aleve,naproxen,advil,ibuprofen) 5 days prior to surgery starting 02/27/15 including aspirin,vitamins   Do not wear jewelry, make-up or nail polish.  Do not wear lotions, powders, or perfumes.  You may wear deodorant.  Do not shave 48 hours prior to surgery.  Men may shave face and neck.  Do not bring valuables to the hospital.  [facility] is not responsible for any belongings or valuables.  Contacts, dentures or bridgework may not be worn into surgery.  Leave your suitcase in the car.  After surgery it may be brought to your room.  For patients admitted to the hospital, discharge time will be determined by your treatment team.  Patients discharged the day of surgery will not be allowed to drive home.   Name and phone number of your driver:   Special instructions:   Special Instructions: [facility] - Preparing for Surgery  Before surgery, you can play an important role.  Because skin is not sterile, your skin needs to be as free of germs as possible.  You can reduce the number of germs on you skin by washing with CHG (chlorahexidine gluconate) soap before surgery.  CHG is an antiseptic cleaner which kills germs and bonds with the skin to continue killing germs even after  washing.  Please DO NOT use if you have an allergy to CHG or antibacterial soaps.  If your skin becomes reddened/irritated stop using the CHG and inform your nurse when you arrive at Short Stay.  Do not shave (including legs and underarms) for at least 48 hours prior to the first CHG shower.  You may shave your face.  Please follow these instructions carefully:   1.  Shower with CHG Soap the night before surgery and the morning of Surgery.  2.  If you choose to wash your hair, wash your hair first as usual with your normal shampoo.  3.  After you shampoo, rinse your hair and body thoroughly to remove the Shampoo.  4.  Use CHG as you would any other liquid soap.  You can apply chg directly  to the skin and wash gently with scrungie or a clean washcloth.  5.  Apply the CHG Soap to your body ONLY FROM THE NECK DOWN.  Do not use on open wounds or open sores.  Avoid contact with your eyes ears, mouth and genitals (private parts).  Wash genitals (private parts)       with your normal soap.  6.  Wash thoroughly, paying special attention to the area where your surgery will be performed.  7.  Thoroughly rinse your body with warm water from the neck down.  8.  DO NOT shower/wash with your normal soap after using and rinsing off the CHG Soap.  9.  Fraser Din  yourself dry with a clean towel.            10.  Wear clean pajamas.            11.  Place clean sheets on your bed the night of your first shower and do not sleep with pets.  Day of Surgery  Do not apply any lotions/deodorants the morning of surgery.  Please wear clean clothes to the hospital/surgery center.  Please read over the following fact sheets that you were given. Pain Booklet, Coughing and Deep Breathing, Total Joint Packet, MRSA Information and Surgical Site Infection Prevention

## 2015-02-24 LAB — URINE CULTURE: Culture: NO GROWTH

## 2015-03-01 MED ORDER — SODIUM CHLORIDE 0.9 % IV SOLN: INTRAVENOUS | Status: DC

## 2015-03-01 MED ORDER — BUPIVACAINE LIPOSOME 1.3 % IJ SUSP
20.0000 mL | INTRAMUSCULAR | Status: AC
  Administered 2015-03-04: 20 mL
  Filled 2015-03-01: qty 20

## 2015-03-01 MED ORDER — TRANEXAMIC ACID 1000 MG/10ML IV SOLN
1000.0000 mg | INTRAVENOUS | Status: AC
  Administered 2015-03-04: 1000 mg via INTRAVENOUS
  Filled 2015-03-01: qty 10

## 2015-03-01 MED ORDER — CHLORHEXIDINE GLUCONATE 4 % EX LIQD: 60.0000 mL | Freq: Once | CUTANEOUS | Status: DC

## 2015-03-01 MED ORDER — CEFAZOLIN SODIUM-DEXTROSE 2-3 GM-% IV SOLR
2.0000 g | INTRAVENOUS | Status: AC
  Administered 2015-03-04: 2 g via INTRAVENOUS
  Filled 2015-03-01: qty 50

## 2015-03-01 NOTE — Progress Notes (Signed)
Re-requested stress test, echo, cardiac cath, ekg, ov from [facility].

## 2015-03-03 NOTE — Anesthesia Preprocedure Evaluation (Addendum)
Anesthesia Evaluation  Patient identified by MRN, date of birth, ID band Patient awake    Reviewed: Allergy & Precautions, NPO status , Patient's Chart, lab work & pertinent test results  History of Anesthesia Complications Negative for: history of anesthetic complications  Airway Mallampati: I  TM Distance: >3 FB Neck ROM: Full    Dental   Pulmonary former smoker,    Pulmonary exam normal        Cardiovascular hypertension, Pt. on medications Normal cardiovascular exam     Neuro/Psych PSYCHIATRIC DISORDERS Anxiety negative neurological ROS     GI/Hepatic Neg liver ROS, GERD  Medicated,  Endo/Other  Hypothyroidism   Renal/GU negative Renal ROS     Musculoskeletal  (+) Arthritis ,   Abdominal   Peds  Hematology   Anesthesia Other Findings   Reproductive/Obstetrics                            Anesthesia Physical Anesthesia Plan  ASA: II  Anesthesia Plan: MAC and Spinal   Post-op Pain Management:    Induction: Intravenous  Airway Management Planned: Simple Face Mask  Additional Equipment:   Intra-op Plan:   Post-operative Plan:   Informed Consent: I have reviewed the patients History and Physical, chart, labs and discussed the procedure including the risks, benefits and alternatives for the proposed anesthesia with the patient or authorized representative who has indicated his/her understanding and acceptance.     Plan Discussed with: CRNA and Surgeon  Anesthesia Plan Comments:        Anesthesia Quick Evaluation

## 2015-03-04 ENCOUNTER — Ambulatory Visit (HOSPITAL_COMMUNITY): Payer: Medicare Other | Admitting: Anesthesiology

## 2015-03-04 ENCOUNTER — Observation Stay (HOSPITAL_COMMUNITY)
Admission: RE | Admit: 2015-03-04 | Discharge: 2015-03-05 | Disposition: A | Discharge status: Home Health | Payer: Medicare Other | Source: Ambulatory Visit | Attending: Orthopedic Surgery | Admitting: Orthopedic Surgery

## 2015-03-04 ENCOUNTER — Ambulatory Visit (HOSPITAL_COMMUNITY): Payer: Medicare Other | Admitting: Emergency Medicine

## 2015-03-04 ENCOUNTER — Encounter (HOSPITAL_COMMUNITY): Payer: Self-pay

## 2015-03-04 ENCOUNTER — Encounter (HOSPITAL_COMMUNITY)
Admission: RE | Disposition: A | Discharge status: Home Health | Payer: Self-pay | Source: Ambulatory Visit | Attending: Orthopedic Surgery

## 2015-03-04 DIAGNOSIS — M179 Osteoarthritis of knee, unspecified: Secondary | ICD-10-CM | POA: Diagnosis not present

## 2015-03-04 DIAGNOSIS — Z87891 Personal history of nicotine dependence: Secondary | ICD-10-CM | POA: Insufficient documentation

## 2015-03-04 DIAGNOSIS — Z96652 Presence of left artificial knee joint: Secondary | ICD-10-CM

## 2015-03-04 DIAGNOSIS — E039 Hypothyroidism, unspecified: Secondary | ICD-10-CM | POA: Insufficient documentation

## 2015-03-04 DIAGNOSIS — I10 Essential (primary) hypertension: Secondary | ICD-10-CM | POA: Insufficient documentation

## 2015-03-04 DIAGNOSIS — R262 Difficulty in walking, not elsewhere classified: Secondary | ICD-10-CM | POA: Insufficient documentation

## 2015-03-04 DIAGNOSIS — D62 Acute posthemorrhagic anemia: Secondary | ICD-10-CM | POA: Insufficient documentation

## 2015-03-04 DIAGNOSIS — M1712 Unilateral primary osteoarthritis, left knee: Principal | ICD-10-CM | POA: Insufficient documentation

## 2015-03-04 HISTORY — DX: Depression, unspecified: F32.A

## 2015-03-04 HISTORY — PX: PARTIAL KNEE ARTHROPLASTY: SHX2174

## 2015-03-04 HISTORY — DX: Major depressive disorder, single episode, unspecified: F32.9

## 2015-03-04 SURGERY — ARTHROPLASTY, KNEE, UNICOMPARTMENTAL
Anesthesia: Monitor Anesthesia Care | Site: Knee | Laterality: Left

## 2015-03-04 MED ORDER — SENNOSIDES-DOCUSATE SODIUM 8.6-50 MG PO TABS: 1.0000 | ORAL_TABLET | Freq: Every evening | ORAL | Status: DC | PRN

## 2015-03-04 MED ORDER — DIPHENHYDRAMINE HCL 12.5 MG/5ML PO ELIX: 12.5000 mg | ORAL_SOLUTION | ORAL | Status: DC | PRN

## 2015-03-04 MED ORDER — METOCLOPRAMIDE HCL 5 MG PO TABS: 5.0000 mg | ORAL_TABLET | Freq: Three times a day (TID) | ORAL | Status: DC | PRN

## 2015-03-04 MED ORDER — OXYCODONE HCL 5 MG PO TABS
ORAL_TABLET | ORAL | Status: AC
  Administered 2015-03-04: 10 mg via ORAL
  Filled 2015-03-04: qty 2

## 2015-03-04 MED ORDER — MIDAZOLAM HCL 2 MG/2ML IJ SOLN
INTRAMUSCULAR | Status: AC
  Filled 2015-03-04: qty 4

## 2015-03-04 MED ORDER — CELECOXIB 200 MG PO CAPS
200.0000 mg | ORAL_CAPSULE | Freq: Two times a day (BID) | ORAL | Status: DC
  Administered 2015-03-04 – 2015-03-05 (×2): 200 mg via ORAL
  Filled 2015-03-04 (×2): qty 1

## 2015-03-04 MED ORDER — 0.9 % SODIUM CHLORIDE (POUR BTL) OPTIME
TOPICAL | Status: DC | PRN
  Administered 2015-03-04: 1000 mL

## 2015-03-04 MED ORDER — ALPRAZOLAM 0.25 MG PO TABS: 0.2500 mg | ORAL_TABLET | Freq: Every evening | ORAL | Status: DC | PRN

## 2015-03-04 MED ORDER — METOCLOPRAMIDE HCL 5 MG/ML IJ SOLN: 5.0000 mg | Freq: Three times a day (TID) | INTRAMUSCULAR | Status: DC | PRN

## 2015-03-04 MED ORDER — DOCUSATE SODIUM 100 MG PO CAPS
100.0000 mg | ORAL_CAPSULE | Freq: Two times a day (BID) | ORAL | Status: DC
  Administered 2015-03-04 – 2015-03-05 (×2): 100 mg via ORAL
  Filled 2015-03-04 (×2): qty 1

## 2015-03-04 MED ORDER — PHENOL 1.4 % MT LIQD: 1.0000 | OROMUCOSAL | Status: DC | PRN

## 2015-03-04 MED ORDER — ZOLPIDEM TARTRATE 5 MG PO TABS
5.0000 mg | ORAL_TABLET | Freq: Every evening | ORAL | Status: DC | PRN
Start: 2015-03-04 — End: 2015-03-05
  Administered 2015-03-04: 5 mg via ORAL
  Filled 2015-03-04: qty 1

## 2015-03-04 MED ORDER — DICYCLOMINE HCL 10 MG PO CAPS
10.0000 mg | ORAL_CAPSULE | Freq: Every day | ORAL | Status: DC
  Administered 2015-03-05: 10 mg via ORAL
  Filled 2015-03-04: qty 1

## 2015-03-04 MED ORDER — HYDROMORPHONE HCL 1 MG/ML IJ SOLN
INTRAMUSCULAR | Status: AC
  Administered 2015-03-04: 0.5 mg via INTRAVENOUS
  Filled 2015-03-04: qty 1

## 2015-03-04 MED ORDER — ASPIRIN EC 325 MG PO TBEC
325.0000 mg | DELAYED_RELEASE_TABLET | Freq: Every day | ORAL | Status: DC
  Administered 2015-03-05: 325 mg via ORAL
  Filled 2015-03-04: qty 1

## 2015-03-04 MED ORDER — HYDROMORPHONE HCL 1 MG/ML IJ SOLN
1.0000 mg | INTRAMUSCULAR | Status: DC | PRN
  Administered 2015-03-04 (×2): 1 mg via INTRAVENOUS
  Filled 2015-03-04 (×2): qty 1

## 2015-03-04 MED ORDER — AMLODIPINE BESYLATE 5 MG PO TABS
5.0000 mg | ORAL_TABLET | Freq: Every day | ORAL | Status: DC
  Administered 2015-03-05: 5 mg via ORAL
  Filled 2015-03-04: qty 1

## 2015-03-04 MED ORDER — MIDAZOLAM HCL 5 MG/5ML IJ SOLN
INTRAMUSCULAR | Status: DC | PRN
  Administered 2015-03-04 (×2): 2 mg via INTRAVENOUS

## 2015-03-04 MED ORDER — PANTOPRAZOLE SODIUM 40 MG PO TBEC
40.0000 mg | DELAYED_RELEASE_TABLET | Freq: Every day | ORAL | Status: DC
  Administered 2015-03-05: 40 mg via ORAL
  Filled 2015-03-04: qty 1

## 2015-03-04 MED ORDER — FLEET ENEMA 7-19 GM/118ML RE ENEM: 1.0000 | ENEMA | Freq: Once | RECTAL | Status: DC | PRN

## 2015-03-04 MED ORDER — HYDROMORPHONE HCL 1 MG/ML IJ SOLN
0.2500 mg | INTRAMUSCULAR | Status: DC | PRN
  Administered 2015-03-04 (×2): 0.5 mg via INTRAVENOUS

## 2015-03-04 MED ORDER — ONDANSETRON HCL 4 MG PO TABS: 4.0000 mg | ORAL_TABLET | Freq: Four times a day (QID) | ORAL | Status: DC | PRN

## 2015-03-04 MED ORDER — SODIUM CHLORIDE 0.9 % IV SOLN
INTRAVENOUS | Status: DC
  Administered 2015-03-04: 17:00:00 via INTRAVENOUS
  Administered 2015-03-05: 75 mL/h via INTRAVENOUS

## 2015-03-04 MED ORDER — ESCITALOPRAM OXALATE 10 MG PO TABS
10.0000 mg | ORAL_TABLET | Freq: Every day | ORAL | Status: DC
  Administered 2015-03-05: 10 mg via ORAL
  Filled 2015-03-04: qty 1

## 2015-03-04 MED ORDER — ONDANSETRON HCL 4 MG/2ML IJ SOLN: 4.0000 mg | Freq: Four times a day (QID) | INTRAMUSCULAR | Status: DC | PRN

## 2015-03-04 MED ORDER — ALUM & MAG HYDROXIDE-SIMETH 200-200-20 MG/5ML PO SUSP: 30.0000 mL | ORAL | Status: DC | PRN

## 2015-03-04 MED ORDER — METHOCARBAMOL 1000 MG/10ML IJ SOLN
500.0000 mg | Freq: Four times a day (QID) | INTRAMUSCULAR | Status: DC | PRN
  Filled 2015-03-04: qty 5

## 2015-03-04 MED ORDER — BUPIVACAINE-EPINEPHRINE (PF) 0.25% -1:200000 IJ SOLN
INTRAMUSCULAR | Status: DC | PRN
  Administered 2015-03-04: 30 mL

## 2015-03-04 MED ORDER — PROPOFOL 500 MG/50ML IV EMUL
INTRAVENOUS | Status: DC | PRN
  Administered 2015-03-04: 75 ug/kg/min via INTRAVENOUS
  Administered 2015-03-04: 13:00:00 via INTRAVENOUS

## 2015-03-04 MED ORDER — ACETAMINOPHEN 325 MG PO TABS: 650.0000 mg | ORAL_TABLET | Freq: Four times a day (QID) | ORAL | Status: DC | PRN

## 2015-03-04 MED ORDER — LACTATED RINGERS IV SOLN
INTRAVENOUS | Status: DC
  Administered 2015-03-04: 11:00:00 via INTRAVENOUS

## 2015-03-04 MED ORDER — METHOCARBAMOL 500 MG PO TABS
500.0000 mg | ORAL_TABLET | Freq: Four times a day (QID) | ORAL | Status: DC | PRN
  Administered 2015-03-04 – 2015-03-05 (×2): 500 mg via ORAL
  Filled 2015-03-04 (×3): qty 1

## 2015-03-04 MED ORDER — OXYCODONE HCL 5 MG PO TABS
5.0000 mg | ORAL_TABLET | ORAL | Status: DC | PRN
  Administered 2015-03-04 – 2015-03-05 (×4): 10 mg via ORAL
  Filled 2015-03-04 (×3): qty 2

## 2015-03-04 MED ORDER — ACETAMINOPHEN 650 MG RE SUPP: 650.0000 mg | Freq: Four times a day (QID) | RECTAL | Status: DC | PRN

## 2015-03-04 MED ORDER — PROMETHAZINE HCL 25 MG/ML IJ SOLN: 6.2500 mg | INTRAMUSCULAR | Status: DC | PRN

## 2015-03-04 MED ORDER — BISACODYL 5 MG PO TBEC: 5.0000 mg | DELAYED_RELEASE_TABLET | Freq: Every day | ORAL | Status: DC | PRN

## 2015-03-04 MED ORDER — LEVOTHYROXINE SODIUM 125 MCG PO TABS
125.0000 ug | ORAL_TABLET | Freq: Every day | ORAL | Status: DC
  Administered 2015-03-05: 125 ug via ORAL
  Filled 2015-03-04: qty 1

## 2015-03-04 MED ORDER — MENTHOL 3 MG MT LOZG: 1.0000 | LOZENGE | OROMUCOSAL | Status: DC | PRN

## 2015-03-04 MED ORDER — CEFAZOLIN SODIUM-DEXTROSE 2-3 GM-% IV SOLR
2.0000 g | Freq: Four times a day (QID) | INTRAVENOUS | Status: AC
  Administered 2015-03-04 (×2): 2 g via INTRAVENOUS
  Filled 2015-03-04 (×2): qty 50

## 2015-03-04 MED ORDER — FENTANYL CITRATE (PF) 250 MCG/5ML IJ SOLN
INTRAMUSCULAR | Status: AC
  Filled 2015-03-04: qty 5

## 2015-03-04 MED ORDER — BUPIVACAINE-EPINEPHRINE (PF) 0.25% -1:200000 IJ SOLN
INTRAMUSCULAR | Status: AC
  Filled 2015-03-04: qty 30

## 2015-03-04 MED ORDER — SODIUM CHLORIDE 0.9 % IJ SOLN
INTRAMUSCULAR | Status: DC | PRN
  Administered 2015-03-04: 20 mL

## 2015-03-04 SURGICAL SUPPLY — 72 items
BANDAGE ESMARK 6X9 LF (GAUZE/BANDAGES/DRESSINGS) ×1 IMPLANT
BLADE SAW RECIP 87.9 MT (BLADE) IMPLANT
BLADE SAW SGTL 13.0X1.19X90.0M (BLADE) ×3 IMPLANT
BLADE SAW SGTL 13X75X1.27 (BLADE) IMPLANT
BLADE SAW SGTL 83.5X18.5 (BLADE) IMPLANT
BNDG ELASTIC 6X10 VLCR STRL LF (GAUZE/BANDAGES/DRESSINGS) ×3 IMPLANT
BNDG ESMARK 6X9 LF (GAUZE/BANDAGES/DRESSINGS) ×3
BOWL SMART MIX CTS (DISPOSABLE) ×3 IMPLANT
BUR ROUND FLUTED 5 RND (BURR) ×2 IMPLANT
BUR ROUND FLUTED 5MM RND (BURR) ×1
CAPT KNEE PARTIAL 2 ×2 IMPLANT
CEMENT BONE SIMPLEX SPEEDSET (Cement) ×3 IMPLANT
COVER SURGICAL LIGHT HANDLE (MISCELLANEOUS) ×6 IMPLANT
CUFF TOURNIQUET SINGLE 34IN LL (TOURNIQUET CUFF) ×3 IMPLANT
DRAPE EXTREMITY T 121X128X90 (DRAPE) ×3 IMPLANT
DRAPE IMP U-DRAPE 54X76 (DRAPES) ×3 IMPLANT
DRAPE INCISE 23X17 IOBAN STRL (DRAPES) ×6
DRAPE INCISE IOBAN 23X17 STRL (DRAPES) ×3 IMPLANT
DRAPE INCISE IOBAN 66X45 STRL (DRAPES) ×9 IMPLANT
DRAPE ORTHO SPLIT 77X108 STRL (DRAPES) ×2
DRAPE PROXIMA HALF (DRAPES) ×3 IMPLANT
DRAPE SURG ORHT 6 SPLT 77X108 (DRAPES) ×1 IMPLANT
DRAPE U-SHAPE 47X51 STRL (DRAPES) ×3 IMPLANT
DRSG ADAPTIC 3X8 NADH LF (GAUZE/BANDAGES/DRESSINGS) ×3 IMPLANT
DRSG PAD ABDOMINAL 8X10 ST (GAUZE/BANDAGES/DRESSINGS) ×3 IMPLANT
DURAPREP 26ML APPLICATOR (WOUND CARE) ×6 IMPLANT
ELECT REM PT RETURN 9FT ADLT (ELECTROSURGICAL) ×3
ELECTRODE REM PT RTRN 9FT ADLT (ELECTROSURGICAL) ×1 IMPLANT
EVACUATOR 1/8 PVC DRAIN (DRAIN) ×3 IMPLANT
FLUID NSS /IRRIG 3000 ML XXX (IV SOLUTION) ×3 IMPLANT
GAUZE SPONGE 4X4 12PLY STRL (GAUZE/BANDAGES/DRESSINGS) ×3 IMPLANT
GLOVE BIOGEL M 7.0 STRL (GLOVE) IMPLANT
GLOVE BIOGEL PI IND STRL 6.5 (GLOVE) ×1 IMPLANT
GLOVE BIOGEL PI IND STRL 7.5 (GLOVE) IMPLANT
GLOVE BIOGEL PI IND STRL 8.5 (GLOVE) ×2 IMPLANT
GLOVE BIOGEL PI INDICATOR 6.5 (GLOVE) ×2
GLOVE BIOGEL PI INDICATOR 7.5 (GLOVE)
GLOVE BIOGEL PI INDICATOR 8.5 (GLOVE) ×4
GLOVE SURG ORTHO 8.0 STRL STRW (GLOVE) ×6 IMPLANT
GLOVE SURG SS PI 6.5 STRL IVOR (GLOVE) ×3 IMPLANT
GOWN STRL REUS W/ TWL LRG LVL3 (GOWN DISPOSABLE) ×1 IMPLANT
GOWN STRL REUS W/ TWL XL LVL3 (GOWN DISPOSABLE) ×2 IMPLANT
GOWN STRL REUS W/TWL LRG LVL3 (GOWN DISPOSABLE) ×2
GOWN STRL REUS W/TWL XL LVL3 (GOWN DISPOSABLE) ×4
HANDPIECE INTERPULSE COAX TIP (DISPOSABLE) ×2
HOOD PEEL AWAY FACE SHEILD DIS (HOOD) ×9 IMPLANT
KIT BASIN OR (CUSTOM PROCEDURE TRAY) ×3 IMPLANT
KIT IMPL STRL TIB IPOLY IUNI ×1 IMPLANT
KIT ROOM TURNOVER OR (KITS) ×3 IMPLANT
MANIFOLD NEPTUNE II (INSTRUMENTS) ×3 IMPLANT
NEEDLE 21X1 OR PACK (NEEDLE) ×3 IMPLANT
NEEDLE HYPO 21X1 ECLIPSE (NEEDLE) ×3 IMPLANT
NS IRRIG 1000ML POUR BTL (IV SOLUTION) ×3 IMPLANT
PACK TOTAL JOINT (CUSTOM PROCEDURE TRAY) ×3 IMPLANT
PACK UNIVERSAL I (CUSTOM PROCEDURE TRAY) ×3 IMPLANT
PAD ARMBOARD 7.5X6 YLW CONV (MISCELLANEOUS) ×6 IMPLANT
PADDING CAST COTTON 6X4 STRL (CAST SUPPLIES) ×3 IMPLANT
SET HNDPC FAN SPRY TIP SCT (DISPOSABLE) ×1 IMPLANT
SPONGE GAUZE 4X4 12PLY STER LF (GAUZE/BANDAGES/DRESSINGS) ×3 IMPLANT
STAPLER VISISTAT 35W (STAPLE) ×3 IMPLANT
SUCTION FRAZIER TIP 10 FR DISP (SUCTIONS) ×3 IMPLANT
SUT VIC AB 0 CT1 27 (SUTURE) ×4
SUT VIC AB 0 CT1 27XBRD ANBCTR (SUTURE) ×2 IMPLANT
SUT VIC AB 1 CT1 27 (SUTURE) ×2
SUT VIC AB 1 CT1 27XBRD ANBCTR (SUTURE) ×1 IMPLANT
SUT VIC AB 2-0 CT1 27 (SUTURE) ×2
SUT VIC AB 2-0 CT1 TAPERPNT 27 (SUTURE) ×1 IMPLANT
SYR 20CC LL (SYRINGE) ×6 IMPLANT
SYRINGE IRR TOOMEY STRL 70CC (SYRINGE) ×3 IMPLANT
TOWEL OR 17X24 6PK STRL BLUE (TOWEL DISPOSABLE) ×3 IMPLANT
TOWEL OR 17X26 10 PK STRL BLUE (TOWEL DISPOSABLE) ×3 IMPLANT
WATER STERILE IRR 1000ML POUR (IV SOLUTION) ×9 IMPLANT

## 2015-03-04 NOTE — Transfer of Care (Signed)
Immediate Anesthesia Transfer of Care Note  Patient: Ethan Barnes  Procedure(s) Performed: Procedure(s): UNICOMPARTMENTAL LEFT KNEE (Left)  Patient Location: PACU  Anesthesia Type:MAC and Spinal  Level of Consciousness: awake, alert  and oriented  Airway & Oxygen Therapy: Patient Spontanous Breathing  Post-op Assessment: Report given to RN and Post -op Vital signs reviewed and stable  Post vital signs: Reviewed and stable  Last Vitals:  Filed Vitals:   03/04/15 1356  BP:   Pulse:   Temp: 36.4 C  Resp:     Complications: No apparent anesthesia complications

## 2015-03-04 NOTE — H&P (Signed)
  Ethan Barnes MRN:  [id number] DOB/SEX:  [birth year]/male  CHIEF COMPLAINT:  Painful left Knee  HISTORY: Patient is a 74 y.o. male presented with a history of pain in the left knee. Onset of symptoms was gradual starting several years ago with rapidly worsening course since that time. Prior procedures on the knee include arthroscopy. Patient has been treated conservatively with over-the-counter NSAIDs and activity modification. Patient currently rates pain in the knee at 9 out of 10 with activity. There is no pain at night.  PAST MEDICAL HISTORY: There are no active problems to display for this patient.  Past Medical History  Diagnosis Date  . Anginal pain (HCC)     occ- stress, indigestion  . Hypertension   . Heart murmur     child  . Hypothyroidism   . Pneumonia     hx ?  Marland Kitchen Anxiety   . GERD (gastroesophageal reflux disease)     occ  . Arthritis    Past Surgical History  Procedure Laterality Date  . Knee arthroscopy Left 11/15    stem cell experiment- did not work  . Vasectomy    . Eye surgery Left     cataract     MEDICATIONS:   No prescriptions prior to admission    ALLERGIES:   Allergies  Allergen Reactions  . Scallops [Shellfish Allergy] Nausea And Vomiting    REVIEW OF SYSTEMS:  Pertinent items are noted in HPI.   FAMILY HISTORY:  No family history on file.  SOCIAL HISTORY:   Social History  Substance Use Topics  . Smoking status: Former Smoker -- 1.00 packs/day for 37 years    Types: Cigarettes    Quit date: 02/21/1986  . Smokeless tobacco: Not on file  . Alcohol Use: 8.4 oz/week    14 Shots of liquor per week     Comment: scotch     EXAMINATION:  Vital signs in last 24 hours:    General appearance: alert, cooperative and no distress Lungs: clear to auscultation bilaterally Heart: regular rate and rhythm, S1, S2 normal, no murmur, click, rub or gallop Abdomen: soft, non-tender; bowel sounds normal; no masses,  no  organomegaly Extremities: extremities normal, atraumatic, no cyanosis or edema and Homans sign is negative, no sign of DVT Pulses: 2+ and symmetric Skin: Skin color, texture, turgor normal. No rashes or lesions Neurologic: Alert and oriented X 3, normal strength and tone. Normal symmetric reflexes. Normal coordination and gait  Musculoskeletal:  ROM 0-115, Ligaments intact,  Imaging Review Plain radiographs demonstrate severe degenerative joint disease of the left knee in the medial compartment. The overall alignment is significant varus. The bone quality appears to be excellent for age and reported activity level.  Assessment/Plan: Primary osteoarthritis medial compartment, left knee   The patient history, physical examination and imaging studies are consistent with advanced degenerative joint disease of the left knee in the medial compartment. The patient has failed conservative treatment.  The clearance notes were reviewed.  After discussion with the patient it was felt that Unicompatmental Knee Replacement was indicated. The procedure,  risks, and benefits of total knee arthroplasty were presented and reviewed. The risks including but not limited to aseptic loosening, infection, blood clots, vascular injury, stiffness, patella tracking problems complications among others were discussed. The patient acknowledged the explanation, agreed to proceed with the plan.  Ethan Barnes 03/04/2015, 6:32 AM

## 2015-03-04 NOTE — Progress Notes (Signed)
Orthopedic Tech Progress Note Patient Details:  Ethan Barnes 21-Nov-1940 [id number]  CPM Left Knee CPM Left Knee: On Left Knee Flexion (Degrees): 90 Left Knee Extension (Degrees): 0 Additional Comments: Trapeze bar and foot roll   [name] 03/04/2015, 5:41 PM

## 2015-03-04 NOTE — Anesthesia Postprocedure Evaluation (Signed)
Anesthesia Post Note  Patient: Ethan Barnes  Procedure(s) Performed: Procedure(s) (LRB): UNICOMPARTMENTAL LEFT KNEE (Left)  Anesthesia type: spinal  Patient location: PACU  Post pain: Pain level controlled  Post assessment: Patient's Cardiovascular Status Stable  Last Vitals:  Filed Vitals:   03/04/15 1545  BP: 135/75  Pulse: 62  Temp:   Resp: 14    Post vital signs: Reviewed and stable  Level of consciousness: awake  Complications: No apparent anesthesia complications

## 2015-03-04 NOTE — Anesthesia Procedure Notes (Signed)
Procedure Name: MAC Date/Time: 03/04/2015 11:40 AM Performed by: [name] Pre-anesthesia Checklist: Patient identified, Emergency Drugs available, Suction available, Patient being monitored and Timeout performed Patient Re-evaluated:Patient Re-evaluated prior to inductionOxygen Delivery Method: Simple face mask Preoxygenation: Pre-oxygenation with 100% oxygen Placement Confirmation: positive ETCO2 Dental Injury: Teeth and Oropharynx as per pre-operative assessment

## 2015-03-05 ENCOUNTER — Encounter (HOSPITAL_COMMUNITY): Payer: Self-pay | Admitting: Orthopedic Surgery

## 2015-03-05 DIAGNOSIS — E039 Hypothyroidism, unspecified: Secondary | ICD-10-CM | POA: Diagnosis not present

## 2015-03-05 DIAGNOSIS — R262 Difficulty in walking, not elsewhere classified: Secondary | ICD-10-CM | POA: Diagnosis not present

## 2015-03-05 DIAGNOSIS — I10 Essential (primary) hypertension: Secondary | ICD-10-CM | POA: Diagnosis not present

## 2015-03-05 DIAGNOSIS — M1712 Unilateral primary osteoarthritis, left knee: Secondary | ICD-10-CM | POA: Diagnosis not present

## 2015-03-05 DIAGNOSIS — D62 Acute posthemorrhagic anemia: Secondary | ICD-10-CM | POA: Diagnosis not present

## 2015-03-05 DIAGNOSIS — Z87891 Personal history of nicotine dependence: Secondary | ICD-10-CM | POA: Diagnosis not present

## 2015-03-05 LAB — CBC
HEMATOCRIT: 38 % — AB (ref 39.0–52.0)
HEMOGLOBIN: 13 g/dL (ref 13.0–17.0)
MCH: 32.2 pg (ref 26.0–34.0)
MCHC: 34.2 g/dL (ref 30.0–36.0)
MCV: 94.1 fL (ref 78.0–100.0)
Platelets: 207 10*3/uL (ref 150–400)
RBC: 4.04 MIL/uL — ABNORMAL LOW (ref 4.22–5.81)
RDW: 12.4 % (ref 11.5–15.5)
WBC: 9.2 10*3/uL (ref 4.0–10.5)

## 2015-03-05 LAB — BASIC METABOLIC PANEL
ANION GAP: 8 (ref 5–15)
BUN: 11 mg/dL (ref 6–20)
CHLORIDE: 98 mmol/L — AB (ref 101–111)
CO2: 26 mmol/L (ref 22–32)
Calcium: 8.1 mg/dL — ABNORMAL LOW (ref 8.9–10.3)
Creatinine, Ser: 1.31 mg/dL — ABNORMAL HIGH (ref 0.61–1.24)
GFR calc Af Amer: >60 mL/min (ref >60)
GFR, EST NON AFRICAN AMERICAN: 53 mL/min — AB (ref >60)
Glucose, Bld: 117 mg/dL — ABNORMAL HIGH (ref 65–99)
POTASSIUM: 3.6 mmol/L (ref 3.5–5.1)
SODIUM: 132 mmol/L — AB (ref 135–145)

## 2015-03-05 MED ORDER — OXYCODONE HCL 5 MG PO TABS: 5.0000 mg | ORAL_TABLET | ORAL | Status: AC | PRN

## 2015-03-05 MED ORDER — TIZANIDINE HCL 2 MG PO TABS: 2.0000 mg | ORAL_TABLET | Freq: Four times a day (QID) | ORAL | Status: AC | PRN

## 2015-03-05 MED ORDER — ASPIRIN 325 MG PO TBEC: 325.0000 mg | DELAYED_RELEASE_TABLET | Freq: Every day | ORAL | Status: AC

## 2015-03-05 MED ORDER — IBUPROFEN 800 MG PO TABS: 800.0000 mg | ORAL_TABLET | Freq: Three times a day (TID) | ORAL | Status: AC

## 2015-03-05 NOTE — Evaluation (Signed)
Physical Therapy Evaluation Patient Details Name: Ethan Barnes MRN: [id number] DOB: August 29, 1940 Today's Date: 03/05/2015   History of Present Illness  S/p Unicompatmental Knee Replacement (left)  Clinical Impression  Pt is s/p TKA resulting in the deficits listed below (see PT Problem List).  Pt will benefit from skilled PT to increase their independence and safety with mobility to allow discharge to the venue listed below. Patient reporting that he is hoping to be able to D/C to home today. Declining second PT session at this time. Offering to return to review mobility or HEP if needed. Will continue to follow with anticipation of D/C home. Patient denies any questions or concerns.       Follow Up Recommendations Home health PT;Supervision for mobility/OOB    Equipment Recommendations  None recommended by PT;Other (comment) (patient reports having rw at home)    Recommendations for Other Services       Precautions / Restrictions Precautions Precautions: Knee Precaution Booklet Issued: Yes (comment) Precaution Comments: Reviewed no pillow under knee, HEP provided and reviewed Restrictions Weight Bearing Restrictions: Yes LLE Weight Bearing: Weight bearing as tolerated      Mobility  Bed Mobility Overal bed mobility: Needs Assistance Bed Mobility: Sit to Supine      Sit to supine: Supervision   General bed mobility comments: bed flat, no use of rail.   Transfers Overall transfer level: Needs assistance Equipment used: Rolling walker (2 wheeled) Transfers: Sit to/from Stand Sit to Stand: Min guard         General transfer comment: cues for hand placement.  Ambulation/Gait Ambulation/Gait assistance: Min guard Ambulation Distance (Feet): 60 Feet Assistive device: Rolling walker (2 wheeled) Gait Pattern/deviations: Step-to pattern;Decreased weight shift to left;Decreased stance time - left Gait velocity: decreased   General Gait Details: Patient avoiding  weight bearing through LLE, encouraging WBAT with ambulation. Repeated cues needed throughout session.   Stairs Stairs: Yes Stairs assistance: Min guard Stair Management: Backwards;No rails;With walker Number of Stairs: 1 General stair comments: No loss of balance, patient reports feeling confident with steps at home. Denies questions or concenrs.   Wheelchair Mobility    Modified Rankin (Stroke Patients Only)       Balance Overall balance assessment: Needs assistance Sitting-balance support: No upper extremity supported Sitting balance-Leahy Scale: Good     Standing balance support: During functional activity Standing balance-Leahy Scale: Fair Standing balance comment: using rw                             Pertinent Vitals/Pain Pain Assessment: 0-10 Pain Score: 8  Pain Location: Lt knee Pain Descriptors / Indicators: Aching Pain Intervention(s): Limited activity within patient's tolerance;Monitored during session    Home Living Family/patient expects to be discharged to:: Private residence Living Arrangements: Spouse/significant other Available Help at Discharge: Family;Available 24 hours/day Type of Home: House Home Access: Stairs to enter Entrance Stairs-Rails: None Entrance Stairs-Number of Steps: 1 Home Layout: Multi-level;Able to live on main level with bedroom/bathroom Home Equipment: Gilford Rile - 2 wheels;Bedside commode;Crutches      Prior Function Level of Independence: Independent               Hand Dominance        Extremity/Trunk Assessment   Upper Extremity Assessment: Defer to OT evaluation           Lower Extremity Assessment: LLE deficits/detail   LLE Deficits / Details: able to perform SLR with lag  Communication   Communication: No difficulties  Cognition Arousal/Alertness: Awake/alert Behavior During Therapy: WFL for tasks assessed/performed Overall Cognitive Status: Within Functional Limits for tasks assessed                       General Comments      Exercises Total Joint Exercises Ankle Circles/Pumps: AROM;15 reps;Both Quad Sets: Strengthening;Left;10 reps Towel Squeeze: Both;Strengthening;5 reps Short Arc Quad: Strengthening;Left;10 reps Heel Slides: AAROM;Left;10 reps Hip ABduction/ADduction: Strengthening;Left;10 reps Straight Leg Raises: Strengthening;Left;10 reps Goniometric ROM: 90 degrees flexion      Assessment/Plan    PT Assessment Patient needs continued PT services  PT Diagnosis Difficulty walking   PT Problem List Decreased strength;Decreased range of motion;Decreased activity tolerance;Decreased balance;Decreased mobility;Pain  PT Treatment Interventions     PT Goals (Current goals can be found in the Care Plan section) Acute Rehab PT Goals Patient Stated Goal: to go home today PT Goal Formulation: With patient Time For Goal Achievement: 03/19/15 Potential to Achieve Goals: Good    Frequency 7X/week   Barriers to discharge        Co-evaluation               End of Session Equipment Utilized During Treatment: Gait belt Activity Tolerance: Patient limited by pain Patient left: in bed;with call bell/phone within reach;Other (comment) (in knee extension stretch, refusing bone foam. ) Nurse Communication: Mobility status;Weight bearing status    Functional Assessment Tool Used: clinical judgment Functional Limitation: Mobility: Walking and moving around Mobility: Walking and Moving Around Current Status (Q1194): At least 20 percent but less than 40 percent impaired, limited or restricted Mobility: Walking and Moving Around Goal Status [phone]): At least 1 percent but less than 20 percent impaired, limited or restricted    Time: 0935-1021 PT Time Calculation (min) (ACUTE ONLY): 46 min   Charges:   PT Evaluation $Initial PT Evaluation Tier I: 1 Procedure PT Treatments $Gait Training: 8-22 mins $Therapeutic Exercise: 8-22 mins   PT G Codes:    PT G-Codes **NOT FOR INPATIENT CLASS** Functional Assessment Tool Used: clinical judgment Functional Limitation: Mobility: Walking and moving around Mobility: Walking and Moving Around Current Status (X4481): At least 20 percent but less than 40 percent impaired, limited or restricted Mobility: Walking and Moving Around Goal Status [phone]): At least 1 percent but less than 20 percent impaired, limited or restricted    Cassell Clement, PT, [place] Pager [phone] Office [phone]  03/05/2015, 1:08 PM

## 2015-03-05 NOTE — Discharge Instructions (Signed)
Diet: As you were doing prior to hospitalization   Activity:  Increase activity slowly as tolerated                  No lifting or driving for 6 weeks  Shower:  May shower without a dressing once there is no drainage from your wound.                 Do NOT wash over the wound.                 Dressing:  You may change your dressing on Wednesday                    Then change the dressing daily with sterile 4"x4"s gauze dressing                     And TED hose for knees.  Weight Bearing:  Weight bearing as tolerated as taught in physical therapy.  Use a                                walker or Crutches as instructed.  To prevent constipation: you may use a stool softener such as -               Colace ( over the counter) 100 mg by mouth twice a day                Drink plenty of fluids ( prune juice may be helpful) and high fiber foods                Miralax ( over the counter) for constipation as needed.    Precautions:  If you experience chest pain or shortness of breath - call 911 immediately               For transfer to the hospital emergency department!!               If you develop a fever greater that 101 F, purulent drainage from wound,                             increased redness or drainage from wound, or calf pain -- Call the office.  Follow- Up Appointment:  Please call for an appointment to be seen on 03/11/15                                              [facility] office:  [phone]            [address] [place], [place] [address]

## 2015-03-05 NOTE — Op Note (Signed)
Dictation Number: [id number]

## 2015-03-05 NOTE — Care Management Note (Signed)
Case Management Note  Patient Details  Name: Ethan Barnes MRN: [id number] Date of Birth: Aug 30, 1940  Subjective/Objective:   74 yr old male s/p  Left unicompartmental knee.             Action/Plan: Case manager spoke with patient concerning discharge planning needs. Patient states he has an appointment at [facility] outpatient therapy on Nov. 4, 2016. He states he has rolling walker, 3in1 and CPM. Will have assistance at home when discharged.    Expected Discharge Date:    03/05/15              Expected Discharge Plan:   Home/Self care  In-House Referral:     Discharge planning Services  CM Consult  Post Acute Care Choice:    Choice offered to:  NA  DME Arranged:  CPM, Walker rolling, 3-N-1 DME Agency:  [employer]  HH Arranged:  NA HH Agency:  NA  Status of Service:  Completed, signed off  Medicare Important Message Given:    Date Medicare IM Given:    Medicare IM give by:    Date Additional Medicare IM Given:    Additional Medicare Important Message give by:     If discussed at [place] of Stay Meetings, dates discussed:    Additional Comments:  Ninfa Meeker, RN 03/05/2015, 1:50 PM

## 2015-03-05 NOTE — Evaluation (Addendum)
Occupational Therapy Evaluation and Discharge Note Patient Details Name: Ethan Barnes MRN: [id number] DOB: 1940-11-25 Today's Date: 03/05/2015    History of Present Illness S/p Unicompatmental Knee Replacement (left)   Clinical Impression   Pt reports he was independent with ADLs and mobility PTA. Currently pt is overall min guard for ADLs and mobility with the exception of min A for LB ADLs. All education complete; pt with no further questions or concerns for OT at this time. Pt plan to d/c home with 24/7 supervision from his wife. Pt ready to d/c home from an OT standpoint; signing off at this time. Thank you for this referral.     Follow Up Recommendations  No OT follow up;Supervision - Intermittent    Equipment Recommendations  None recommended by OT    Recommendations for Other Services       Precautions / Restrictions Precautions Precautions: Knee;Fall Precaution Booklet Issued: No Precaution Comments: Reviewed no pillow under knee Restrictions Weight Bearing Restrictions: Yes LLE Weight Bearing: Weight bearing as tolerated      Mobility Bed Mobility Overal bed mobility: Needs Assistance Bed Mobility: Supine to Sit     Supine to sit: Supervision     General bed mobility comments: Supervision for safety. Pt with good technique. HOB flat, minimal use of bed rails  Transfers Overall transfer level: Needs assistance Equipment used: Rolling walker (2 wheeled) Transfers: Sit to/from Stand Sit to Stand: Min guard         General transfer comment: Min guard for safety. VC for hand placement and technique. Sit to stand from EOB x 1. Decreased weight bearing on LLE, even with max verbal cues    Balance Overall balance assessment: Needs assistance Sitting-balance support: No upper extremity supported;Feet supported Sitting balance-Leahy Scale: Good     Standing balance support: Bilateral upper extremity supported Standing balance-Leahy Scale: Poor Standing  balance comment: RW for support                            ADL Overall ADL's : Needs assistance/impaired Eating/Feeding: Set up;Sitting   Grooming: Min guard;Standing       Lower Body Bathing: Minimal assistance;Sit to/from stand       Lower Body Dressing: Minimal assistance;Sit to/from stand Lower Body Dressing Details (indicate cue type and reason): Educated on use of AE for increased independence; pt declined stating wife would help as needed Toilet Transfer: Min guard;Ambulation;BSC;RW (BSC over toilet)       Tub/ Shower Transfer: Min guard;Ambulation;3 in 1;Rolling walker;Walk-in Lobbyist Details (indicate cue type and reason): Eduated pt on walk in shower transfer technique; pt return demonstrated technique of simulated walk in shower transfer Functional mobility during ADLs: Min guard;Rolling walker General ADL Comments: No family present during OT eval. Educated pt on compensatory strategies for LB ADLs, need for close supervision during ADLs and functional mobility, edema management techniques, walk in shower transfer technique, use of 3 in 1 over toilet and in shower; pt verbalized understanding.      Vision     Perception     Praxis      Pertinent Vitals/Pain Pain Assessment: 0-10 Pain Score: 9  Pain Location: L knee Pain Descriptors / Indicators: Aching;Sore;Grimacing Pain Intervention(s): Limited activity within patient's tolerance;Monitored during session;Repositioned;RN gave pain meds during session;Ice applied     Hand Dominance     Extremity/Trunk Assessment Upper Extremity Assessment Upper Extremity Assessment: Overall WFL for tasks assessed   Lower  Extremity Assessment Lower Extremity Assessment: Defer to PT evaluation   Cervical / Trunk Assessment Cervical / Trunk Assessment: Normal   Communication Communication Communication: No difficulties   Cognition Arousal/Alertness: Awake/alert Behavior During Therapy:  WFL for tasks assessed/performed Overall Cognitive Status: Within Functional Limits for tasks assessed                     General Comments       Exercises       Shoulder Instructions      Home Living Family/patient expects to be discharged to:: Private residence Living Arrangements: Spouse/significant other Available Help at Discharge: Family;Available 24 hours/day Type of Home: House       Home Layout: Multi-level;Able to live on main level with bedroom/bathroom     Bathroom Shower/Tub: Occupational psychologist: Standard Bathroom Accessibility: Yes How Accessible: Accessible via walker Home Equipment: [place] - 2 wheels;Bedside commode;Crutches          Prior Functioning/Environment Level of Independence: Independent             OT Diagnosis: Acute pain   OT Problem List:     OT Treatment/Interventions:      OT Goals(Current goals can be found in the care plan section) Acute Rehab OT Goals Patient Stated Goal: to go home today OT Goal Formulation: With patient  OT Frequency:     Barriers to D/C:            Co-evaluation              End of Session Equipment Utilized During Treatment: Gait belt;Rolling walker CPM Left Knee CPM Left Knee: Off Nurse Communication: Mobility status;Weight bearing status  Activity Tolerance: Patient tolerated treatment well Patient left: in chair;with call bell/phone within reach   Time: 0086-7619 OT Time Calculation (min): 33 min Charges:  OT General Charges $OT Visit: 1 Procedure OT Evaluation $Initial OT Evaluation Tier I: 1 Procedure OT Treatments $Self Care/Home Management : 8-22 mins G-Codes: OT G-codes **NOT FOR INPATIENT CLASS** Functional Assessment Tool Used: Clinical judgement Functional Limitation: Self care Self Care Current Status (J0932): At least 1 percent but less than 20 percent impaired, limited or restricted Self Care Goal Status (I7124): At least 1 percent but less  than 20 percent impaired, limited or restricted Self Care Discharge Status [phone]): At least 1 percent but less than 20 percent impaired, limited or restricted   Binnie Kand M.S., OTR/L Pager: [phone] 03/05/2015, 9:31 AM

## 2015-03-05 NOTE — Progress Notes (Signed)
SPORTS MEDICINE AND JOINT REPLACEMENT  Lara Mulch, MD   Carlynn Spry, PA-C [place], [place], [place]  [address]                             [phone]   PROGRESS NOTE  Subjective:  negative for Chest Pain  negative for Shortness of Breath  negative for Nausea/Vomiting   negative for Calf Pain  negative for Bowel Movement   Tolerating Diet: yes         Patient reports pain as 6 on 0-10 scale.    Objective: Vital signs in last 24 hours:   Patient Vitals for the past 24 hrs:  BP Temp Temp src Pulse Resp SpO2  03/05/15 1123 (!) 165/78 mmHg - - - - -  03/05/15 0529 (!) 165/78 mmHg 98.3 F (36.8 C) Oral 81 18 98 %  03/05/15 0058 (!) 148/68 mmHg 99 F (37.2 C) Oral 85 18 95 %  03/04/15 2017 137/70 mmHg 97.7 F (36.5 C) Oral 77 16 95 %  03/04/15 1613 (!) 169/82 mmHg 97.1 F (36.2 C) Oral 60 16 97 %  03/04/15 1545 135/75 mmHg - - 62 14 98 %  03/04/15 1530 137/67 mmHg 97.5 F (36.4 C) - (!) 59 13 99 %  03/04/15 1515 (!) 150/78 mmHg - - (!) 58 14 100 %  03/04/15 1500 (!) 152/68 mmHg - - (!) 53 10 99 %  03/04/15 1445 (!) 145/77 mmHg 97.4 F (36.3 C) - (!) 54 13 98 %  03/04/15 1430 (!) 143/71 mmHg - - (!) 56 16 99 %  03/04/15 1415 139/74 mmHg - - (!) 56 11 99 %  03/04/15 1400 130/78 mmHg - - (!) 58 18 98 %  03/04/15 1356 - 97.5 F (36.4 C) - - - -    @flow {1959:LAST@   Intake/Output from previous day:   10/31 0701 - 11/01 0700 In: 1588.8 [P.O.:450; I.V.:978.8] Out: 925 [Urine:875]   Intake/Output this shift:   11/01 0701 - 11/01 1900 In: 2094 [P.O.:240; I.V.:900] Out: 500 [Urine:500]   Intake/Output      10/31 0701 - 11/01 0700 11/01 0701 - 11/02 0700   P.O. 450 240   I.V. (mL/kg) 978.8 (10.4) 900 (9.6)   IV Piggyback 160    Total Intake(mL/kg) 1588.8 (16.9) 1140 (12.1)   Urine (mL/kg/hr) 875 500 (0.9)   Blood 50    Total Output 925 500   Net +663.8 +640        Urine Occurrence 700 x       LABORATORY DATA:  Recent Labs  03/05/15 0532   WBC 9.2  HGB 13.0  HCT 38.0*  PLT 207    Recent Labs  03/05/15 0532  NA 132*  K 3.6  CL 98*  CO2 26  BUN 11  CREATININE 1.31*  GLUCOSE 117*  CALCIUM 8.1*   Lab Results  Component Value Date   INR 1.03 02/22/2015    Examination:  General appearance: alert, cooperative and no distress Extremities: Homans sign is negative, no sign of DVT  Wound Exam:none  Drainage:  None: wound tissue dry  Motor Exam: EHL and FHL Intact  Sensory Exam: Deep Peroneal normal   Assessment:    1 Day Post-Op  Procedure(s) (LRB): UNICOMPARTMENTAL LEFT KNEE (Left)  ADDITIONAL DIAGNOSIS:  Active Problems:   S/P left unicompartmental knee replacement  Acute Blood Loss Anemia   Plan: Physical Therapy as ordered Weight Bearing  as Tolerated (WBAT)  DVT Prophylaxis:  Aspirin  DISCHARGE PLAN: Home  DISCHARGE NEEDS: HHPT, CPM, Walker and 3-in-1 comode seat         Ethan Barnes 03/05/2015, 12:50 PM

## 2015-03-06 NOTE — Op Note (Signed)
NAMEMOIZ, RYANT NO.:  [id number]  MEDICAL RECORD NO.:  [id number]  LOCATION:  [id number]                        FACILITY:  [facility]  PHYSICIAN:  Estill Bamberg. Ronnie Derby, M.D. DATE OF BIRTH:  1940-10-01  DATE OF PROCEDURE:  03/04/2015 DATE OF DISCHARGE:  03/05/2015                              OPERATIVE REPORT   SURGEON:  Estill Bamberg. Ronnie Derby, M.D.  ASSISTANTLowell Guitar. Mercie Eon.  ANESTHESIA:  General.  PREOPERATIVE DIAGNOSIS:  Left knee medial compartment osteoarthritis.  POSTOPERATIVE DIAGNOSIS:  Left knee medial compartment osteoarthritis.  PROCEDURE:  Left knee unicompartmental arthroplasty.  INDICATION FOR PROCEDURE:  The patient is a 74 year old white male with failure of conservative measures for medial compartment osteoarthritis. Informed consent was obtained.  DESCRIPTION OF PROCEDURE:  The patient was laid in supine and administered general anesthesia.  The left leg was prepped and draped in usual fashion.  Paramedian incision was made incorporating the old medial incision.  It was made about 4 cm in length.  A new blade was used to make a median parapatellar arthrotomy and performed partial synovectomy and removal of retrosternal fat pad on the medial side.  A subperiosteal dissection to the deep MCL off the medial crest of the tibia around to the semimembranosus tendon.  I then put the knee in 90 degrees of flexion and good exposure of the medial compartment.  I removed the requisite osteophytes on the femur for the patient's specific cutting block to sit onto the distal femur.  I pinned it into place and made the lug holes and cut the posterior condylar cut.  I then removed the cutting block.  I then scraped off all articular cartilage off the tibia, placed the size B Shim in place and connected the extra- articular alignment guide to the tibia.  I then pinned that into place and made the tibial cut with the reciprocating saw and the sagittal saw, and  then removed the cutting block.  At this point, I placed the femoral trial in place and then placed the size 6 block in, which went in easily in extension and little bit tighter, but went in, nicely in flexion.  I then completed the tibial preparation with custom specific tibial guide, drilling for two lug holes and punching for a keel.  At this point, I then remove the trial components and copiously irrigated and placed the Exparel in the periosteum, the posterior capsule and the periarticular soft tissues.  I then cemented in the tibia first.  I then removed the excess cement and snapping the polyethylene.  I then cemented in the femur with just a small layer on the posterior condylar cut and tamped that into place.  I then went into extension and removed all excess cement.  I let the tourniquet down at 1 hour and obtained hemostasis.  I then closed the arthrotomy with figure-of-eight #1 Vicryl sutures, buried 2-0 Vicryl sutures in deep soft tissues, subcuticular 4-0 Monocryl.  Steri-Strips and dressed with Xeroform, dressing sponges, sterile Webril, Ace wrap.  COMPLICATIONS:  None.  DRAINS:  None.  TOURNIQUET TIME:  60 minutes.          ______________________________ Estill Bamberg Ronnie Derby, M.D.  SDL/MEDQ  D:  03/05/2015  T:  03/06/2015  Job:  [id number]

## 2015-03-08 DIAGNOSIS — M6281 Muscle weakness (generalized): Secondary | ICD-10-CM | POA: Diagnosis not present

## 2015-03-08 DIAGNOSIS — M25662 Stiffness of left knee, not elsewhere classified: Secondary | ICD-10-CM | POA: Diagnosis not present

## 2015-03-08 DIAGNOSIS — Z96652 Presence of left artificial knee joint: Secondary | ICD-10-CM | POA: Diagnosis not present

## 2015-03-11 DIAGNOSIS — Z96652 Presence of left artificial knee joint: Secondary | ICD-10-CM | POA: Diagnosis not present

## 2015-03-11 DIAGNOSIS — M25462 Effusion, left knee: Secondary | ICD-10-CM | POA: Diagnosis not present

## 2015-03-12 DIAGNOSIS — M6281 Muscle weakness (generalized): Secondary | ICD-10-CM | POA: Diagnosis not present

## 2015-03-12 DIAGNOSIS — M25662 Stiffness of left knee, not elsewhere classified: Secondary | ICD-10-CM | POA: Diagnosis not present

## 2015-03-12 DIAGNOSIS — Z96652 Presence of left artificial knee joint: Secondary | ICD-10-CM | POA: Diagnosis not present

## 2015-03-15 DIAGNOSIS — Z96652 Presence of left artificial knee joint: Secondary | ICD-10-CM | POA: Diagnosis not present

## 2015-03-15 DIAGNOSIS — M6281 Muscle weakness (generalized): Secondary | ICD-10-CM | POA: Diagnosis not present

## 2015-03-15 DIAGNOSIS — M25662 Stiffness of left knee, not elsewhere classified: Secondary | ICD-10-CM | POA: Diagnosis not present

## 2015-03-15 NOTE — Discharge Summary (Signed)
SPORTS MEDICINE & JOINT REPLACEMENT   Lara Mulch, MD   Carlynn Spry, PA-C [place], [place], [place]  [address]                             [phone]  PATIENT ID: Ethan Barnes        MRN:  [id number]          DOB/AGE: 05-20-1940 / 74 y.o.    DISCHARGE SUMMARY  ADMISSION DATE:    03/04/2015 DISCHARGE DATE:   03/05/2015  ADMISSION DIAGNOSIS: primary osteoarthritis left knee    DISCHARGE DIAGNOSIS:  primary osteoarthritis left knee    ADDITIONAL DIAGNOSIS: Active Problems:   S/P left unicompartmental knee replacement  Past Medical History  Diagnosis Date  . Anginal pain (HCC)     occ- stress, indigestion  . Hypertension   . Heart murmur     child  . Hypothyroidism   . Pneumonia     hx ?  Marland Kitchen Anxiety   . GERD (gastroesophageal reflux disease)     occ  . Arthritis   . Depression     PROCEDURE: Procedure(s): UNICOMPARTMENTAL LEFT KNEE on 03/04/2015  CONSULTS:     HISTORY:  See H&P in chart  HOSPITAL COURSE:  Teodoro Lota is a 74 y.o. admitted on 03/04/2015 and found to have a diagnosis of primary osteoarthritis left knee.  After appropriate laboratory studies were obtained  they were taken to the operating room on 03/04/2015 and underwent Procedure(s): UNICOMPARTMENTAL LEFT KNEE.   They were given perioperative antibiotics:  Anti-infectives    Start     Dose/Rate Route Frequency Ordered Stop   03/04/15 1700  ceFAZolin (ANCEF) IVPB 2 g/50 mL premix     2 g 100 mL/hr over 30 Minutes Intravenous Every 6 hours 03/04/15 1604 03/04/15 2350   03/04/15 1030  ceFAZolin (ANCEF) IVPB 2 g/50 mL premix     2 g 100 mL/hr over 30 Minutes Intravenous To ShortStay Surgical 03/01/15 1357 03/04/15 1143    .  Tolerated the procedure well.  Placed with a foley intraoperatively.  Given Ofirmev at induction and for 48 hours.    POD# 1: Vital signs were stable.  Patient denied Chest pain, shortness of breath, or calf pain.  Patient was started on Lovenox 30 mg  subcutaneously twice daily at 8am.  Consults to PT, OT, and care management were made.  The patient was weight bearing as tolerated.  CPM was placed on the operative leg 0-90 degrees for 6-8 hours a day.  Incentive spirometry was taught.  Dressing was changed.  Hemovac was discontinued.      Continued  PT for ambulation and exercise program.  IV saline locked.  O2 discontinued.    The remainder of the hospital course was dedicated to ambulation and strengthening.   The patient was discharged on 1 day  in  Good condition.  Blood products given:none  DIAGNOSTIC STUDIES: Recent vital signs: No data found.      Recent laboratory studies: No results for input(s): WBC, HGB, HCT, PLT in the last 168 hours. No results for input(s): NA, K, CL, CO2, BUN, CREATININE, GLUCOSE, CALCIUM in the last 168 hours. Lab Results  Component Value Date   INR 1.03 02/22/2015     Recent Radiographic Studies :  Dg Chest 2 View  02/22/2015  CLINICAL DATA:  Preadmission evaluation for partial knee replacement EXAM: CHEST - 2 VIEW COMPARISON:  None. FINDINGS:  The heart size and mediastinal contours are within normal limits. Both lungs are clear. The visualized skeletal structures are unremarkable. IMPRESSION: No active disease. Electronically Signed   By: Inez Catalina M.D.   On: 02/22/2015 12:56    DISCHARGE INSTRUCTIONS: Discharge Instructions    CPM    Complete by:  As directed   Continuous passive motion machine (CPM):      Use the CPM from 0 to 90 for 6-8 hours per day.      You may increase by 10 per day.  You may break it up into 2 or 3 sessions per day.      Use CPM for 2 weeks or until you are told to stop.     Call MD / Call 911    Complete by:  As directed   If you experience chest pain or shortness of breath, CALL 911 and be transported to the hospital emergency room.  If you develope a fever above 101 F, pus (white drainage) or increased drainage or redness at the wound, or calf pain, call your  surgeon's office.     Change dressing    Complete by:  As directed   Change dressing on Wednesday, then change the dressing daily with sterile 4 x 4 inch gauze dressing and apply TED hose.     Constipation Prevention    Complete by:  As directed   Drink plenty of fluids.  Prune juice may be helpful.  You may use a stool softener, such as Colace (over the counter) 100 mg twice a day.  Use MiraLax (over the counter) for constipation as needed.     Diet - low sodium heart healthy    Complete by:  As directed      Do not put a pillow under the knee. Place it under the heel.    Complete by:  As directed      Driving restrictions    Complete by:  As directed   No driving for 6 weeks     Increase activity slowly as tolerated    Complete by:  As directed      Lifting restrictions    Complete by:  As directed   No lifting for 6 weeks     TED hose    Complete by:  As directed   Use stockings (TED hose) for 2 weeks on both leg(s).  You may remove them at night for sleeping.           DISCHARGE MEDICATIONS:     Medication List    STOP taking these medications        aspirin 81 MG tablet  Replaced by:  aspirin 325 MG EC tablet      TAKE these medications        ALPRAZolam 0.25 MG tablet  Commonly known as:  XANAX  Take 0.25 mg by mouth at bedtime as needed for anxiety.     amLODipine 5 MG tablet  Commonly known as:  NORVASC  Take 5 mg by mouth daily.     aspirin 325 MG EC tablet  Take 1 tablet (325 mg total) by mouth daily with breakfast.     dicyclomine 10 MG capsule  Commonly known as:  BENTYL  Take 10 mg by mouth daily after breakfast.     escitalopram 10 MG tablet  Commonly known as:  LEXAPRO  Take 10 mg by mouth daily.     FIBER SELECT GUMMIES Chew  Chew 2 tablets  by mouth daily.     ibuprofen 800 MG tablet  Commonly known as:  ADVIL,MOTRIN  Take 1 tablet (800 mg total) by mouth 3 (three) times daily.     levothyroxine 125 MCG tablet  Commonly known as:   SYNTHROID, LEVOTHROID  Take 125 mcg by mouth daily before breakfast.     omeprazole 20 MG capsule  Commonly known as:  PRILOSEC  Take 20 mg by mouth daily.     oxyCODONE 5 MG immediate release tablet  Commonly known as:  Oxy IR/ROXICODONE  Take 1-2 tablets (5-10 mg total) by mouth every 3 (three) hours as needed for breakthrough pain.     tiZANidine 2 MG tablet  Commonly known as:  ZANAFLEX  Take 1 tablet (2 mg total) by mouth every 6 (six) hours as needed.        FOLLOW UP VISIT:       Follow-up Information    Follow up with Rudean Haskell, MD. Call on 03/11/2015.   Specialty:  Orthopedic Surgery   Contact information:   [place]  [address] [phone]       DISPOSITION: HOME   CONDITION:  Good   Brooklyn Alfredo 03/15/2015, 5:11 PM

## 2015-03-19 DIAGNOSIS — Z96652 Presence of left artificial knee joint: Secondary | ICD-10-CM | POA: Diagnosis not present

## 2015-03-19 DIAGNOSIS — M25662 Stiffness of left knee, not elsewhere classified: Secondary | ICD-10-CM | POA: Diagnosis not present

## 2015-03-19 DIAGNOSIS — M6281 Muscle weakness (generalized): Secondary | ICD-10-CM | POA: Diagnosis not present

## 2015-03-25 DIAGNOSIS — M25662 Stiffness of left knee, not elsewhere classified: Secondary | ICD-10-CM | POA: Diagnosis not present

## 2015-03-25 DIAGNOSIS — M6281 Muscle weakness (generalized): Secondary | ICD-10-CM | POA: Diagnosis not present

## 2015-03-25 DIAGNOSIS — Z96652 Presence of left artificial knee joint: Secondary | ICD-10-CM | POA: Diagnosis not present

## 2015-03-27 DIAGNOSIS — M25662 Stiffness of left knee, not elsewhere classified: Secondary | ICD-10-CM | POA: Diagnosis not present

## 2015-03-27 DIAGNOSIS — M6281 Muscle weakness (generalized): Secondary | ICD-10-CM | POA: Diagnosis not present

## 2015-03-27 DIAGNOSIS — Z96652 Presence of left artificial knee joint: Secondary | ICD-10-CM | POA: Diagnosis not present

## 2015-04-01 DIAGNOSIS — H2511 Age-related nuclear cataract, right eye: Secondary | ICD-10-CM | POA: Diagnosis not present

## 2015-04-01 DIAGNOSIS — H40013 Open angle with borderline findings, low risk, bilateral: Secondary | ICD-10-CM | POA: Diagnosis not present

## 2015-04-01 DIAGNOSIS — Z961 Presence of intraocular lens: Secondary | ICD-10-CM | POA: Diagnosis not present

## 2015-04-04 DIAGNOSIS — M6281 Muscle weakness (generalized): Secondary | ICD-10-CM | POA: Diagnosis not present

## 2015-04-04 DIAGNOSIS — M25662 Stiffness of left knee, not elsewhere classified: Secondary | ICD-10-CM | POA: Diagnosis not present

## 2015-04-04 DIAGNOSIS — Z96652 Presence of left artificial knee joint: Secondary | ICD-10-CM | POA: Diagnosis not present

## 2015-04-09 DIAGNOSIS — M19011 Primary osteoarthritis, right shoulder: Secondary | ICD-10-CM | POA: Diagnosis not present

## 2015-04-10 DIAGNOSIS — M25662 Stiffness of left knee, not elsewhere classified: Secondary | ICD-10-CM | POA: Diagnosis not present

## 2015-04-10 DIAGNOSIS — Z96652 Presence of left artificial knee joint: Secondary | ICD-10-CM | POA: Diagnosis not present

## 2015-04-10 DIAGNOSIS — M6281 Muscle weakness (generalized): Secondary | ICD-10-CM | POA: Diagnosis not present

## 2015-04-17 DIAGNOSIS — M6281 Muscle weakness (generalized): Secondary | ICD-10-CM | POA: Diagnosis not present

## 2015-04-17 DIAGNOSIS — M25662 Stiffness of left knee, not elsewhere classified: Secondary | ICD-10-CM | POA: Diagnosis not present

## 2015-04-17 DIAGNOSIS — Z96652 Presence of left artificial knee joint: Secondary | ICD-10-CM | POA: Diagnosis not present

## 2015-04-22 DIAGNOSIS — M6281 Muscle weakness (generalized): Secondary | ICD-10-CM | POA: Diagnosis not present

## 2015-04-22 DIAGNOSIS — Z96652 Presence of left artificial knee joint: Secondary | ICD-10-CM | POA: Diagnosis not present

## 2015-04-22 DIAGNOSIS — M25662 Stiffness of left knee, not elsewhere classified: Secondary | ICD-10-CM | POA: Diagnosis not present

## 2015-10-02 IMAGING — CT CT ANKLE*L* W/O CM
3 of 5 series · 6 of 14 positions shown, 7 images · non-contrast
Comparison: None.

CLINICAL DATA: Pre-surgical planning for LEFT total knee
arthroplasty. Custom knee replacement. Partial knee arthroplasty.

EXAM:
CT OF THE LEFT HIP WITHOUT CONTRAST
TECHNIQUE: Multidetector CT imaging of the left hip, LEFT knee, and LEFT ankle
was performed according to the standard protocol. Multiplanar CT
image reconstructions were also generated.

[Series 5: knee soft · axial · 0.35mm/px · z∈[-650,-542]mm · 2 of 131 slices shown]
[im 44/131  soft-tissue]
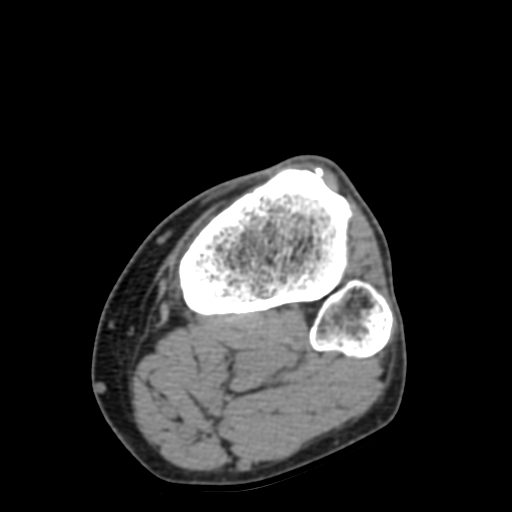
[im 87/131  soft-tissue]
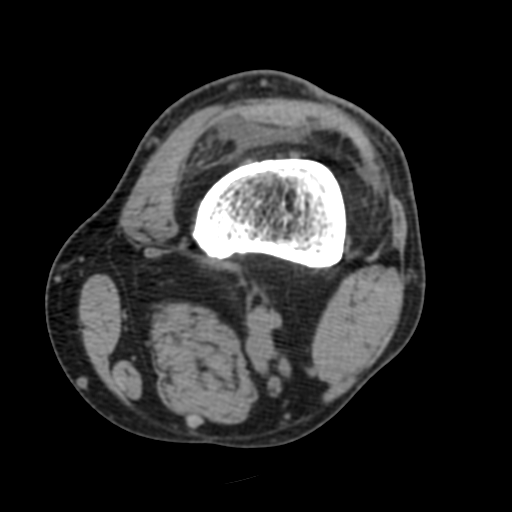

[Series 400: sag · sagittal · 0.65mm/px · 2 of 142 slices shown, 3 images]
[im 48/142  soft-tissue]
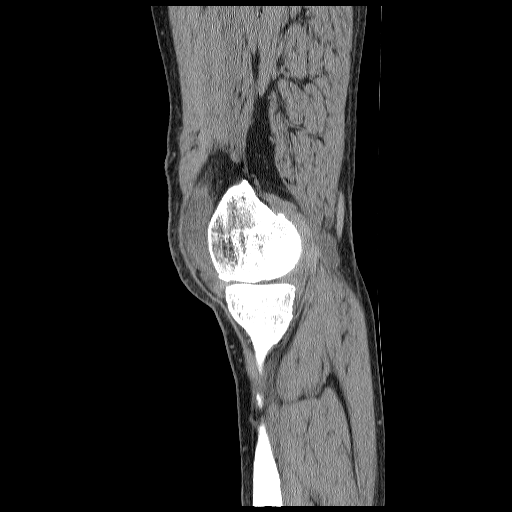
[im 48/142  bone]
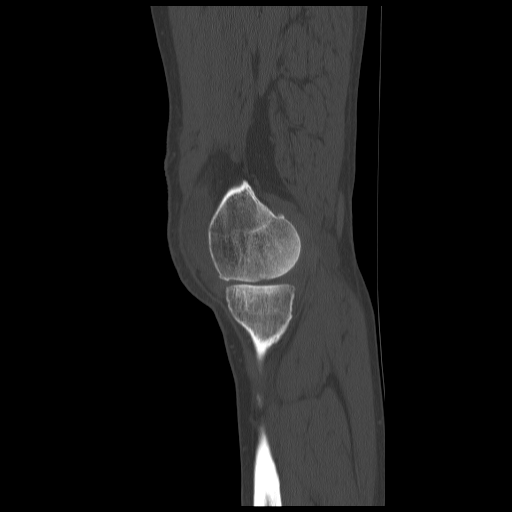
[im 95/142  bone]
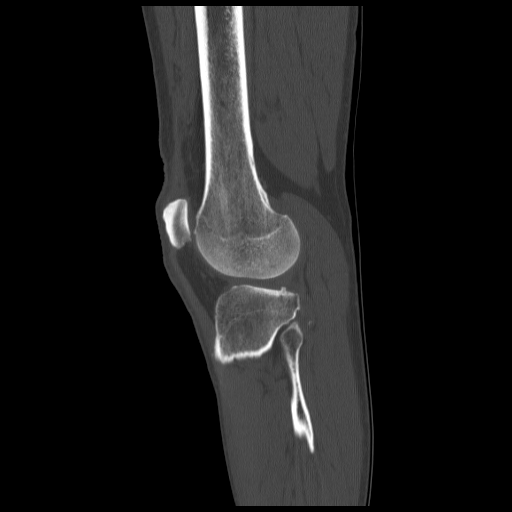

[Series 401: cor · coronal · 0.65mm/px · 2 of 146 slices shown]
[im 49/146  bone]
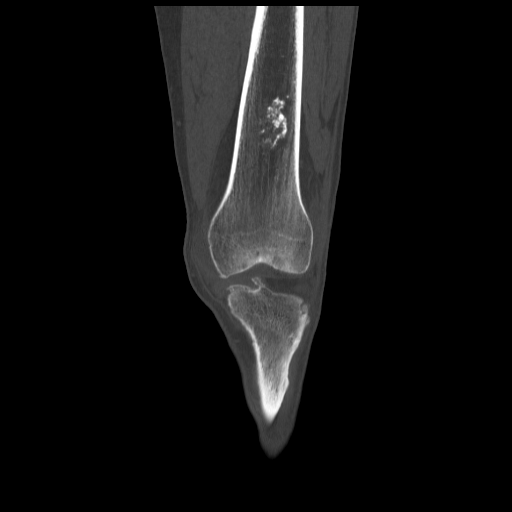
[im 97/146  bone]
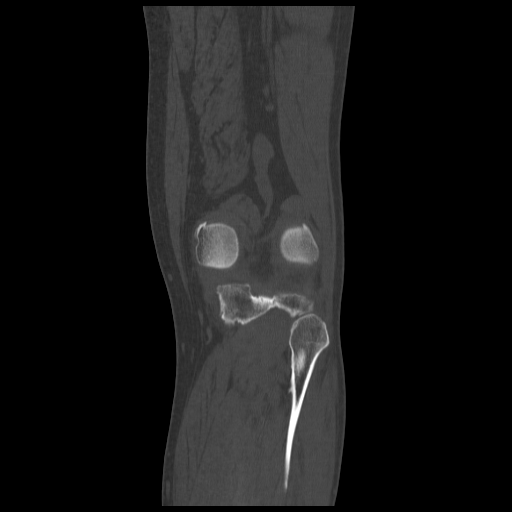

[6 of 14 positions shown; findings below may reference images not displayed]

FINDINGS: LEFT hip: At least mild LEFT hip osteoarthritis is present. Calcific
tendinitis of the common hamstring origin is noted. Marginal
osteophytes are present off the lateral acetabular margin. There is
no large hip effusion.

LEFT knee: In the distal femoral metadiaphysis, there is a partially
calcified lesion the measures 3.5 cm long axis. Based on the
calcification pattern, this is most compatible with a partially
calcified enchondroma. No osteolytic lesions are present. Tibia and
fibula appear within normal limits.

Tricompartmental osteoarthritis of the LEFT knee is present. Lateral
and patellofemoral compartment osteoarthritis is mild. Medial
compartment osteoarthritis is severe with subchondral cystic changes
along the weight-bearing medial femoral condyle. Subchondral
eburnation in the lateral tibial condyle. Small degenerative joint
effusion. Small uncomplicated Baker's cyst.

LEFT ankle: Ankle osteoarthritis is present. Small bone fragments
are present around the medial malleolus. No large osteochondral
lesion of the talar dome is identified.
IMPRESSION: 1. Medial compartment predominant tricompartmental osteoarthritis of
the LEFT knee.
2. At least mild osteoarthritis of the LEFT hip and LEFT ankle.

## 2015-11-16 IMAGING — CR DG CHEST 2V
2 series · 2 of 2 positions shown · non-contrast
Comparison: None.

CLINICAL DATA: Preadmission evaluation for partial knee replacement

EXAM:
CHEST - 2 VIEW

[w chest pa]
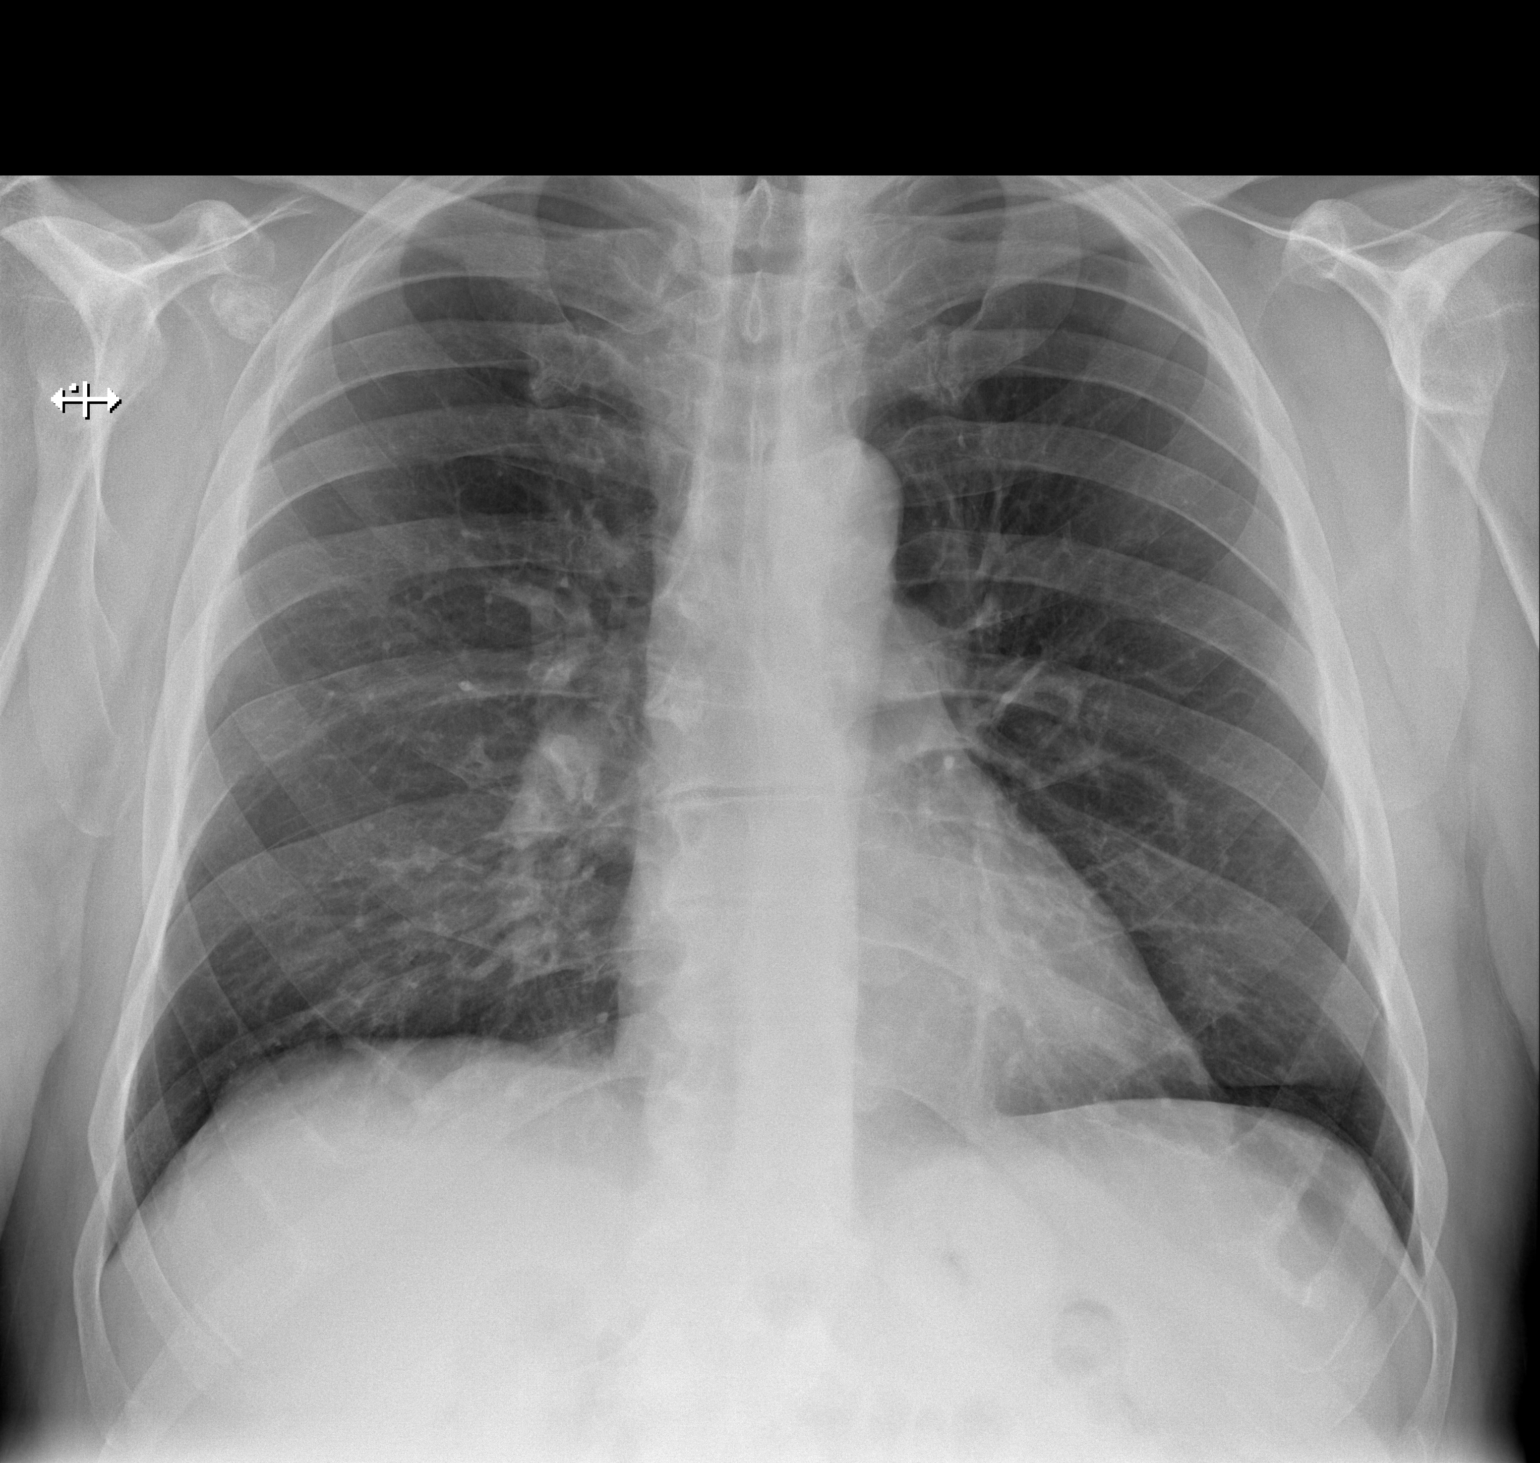

[w chest lat]
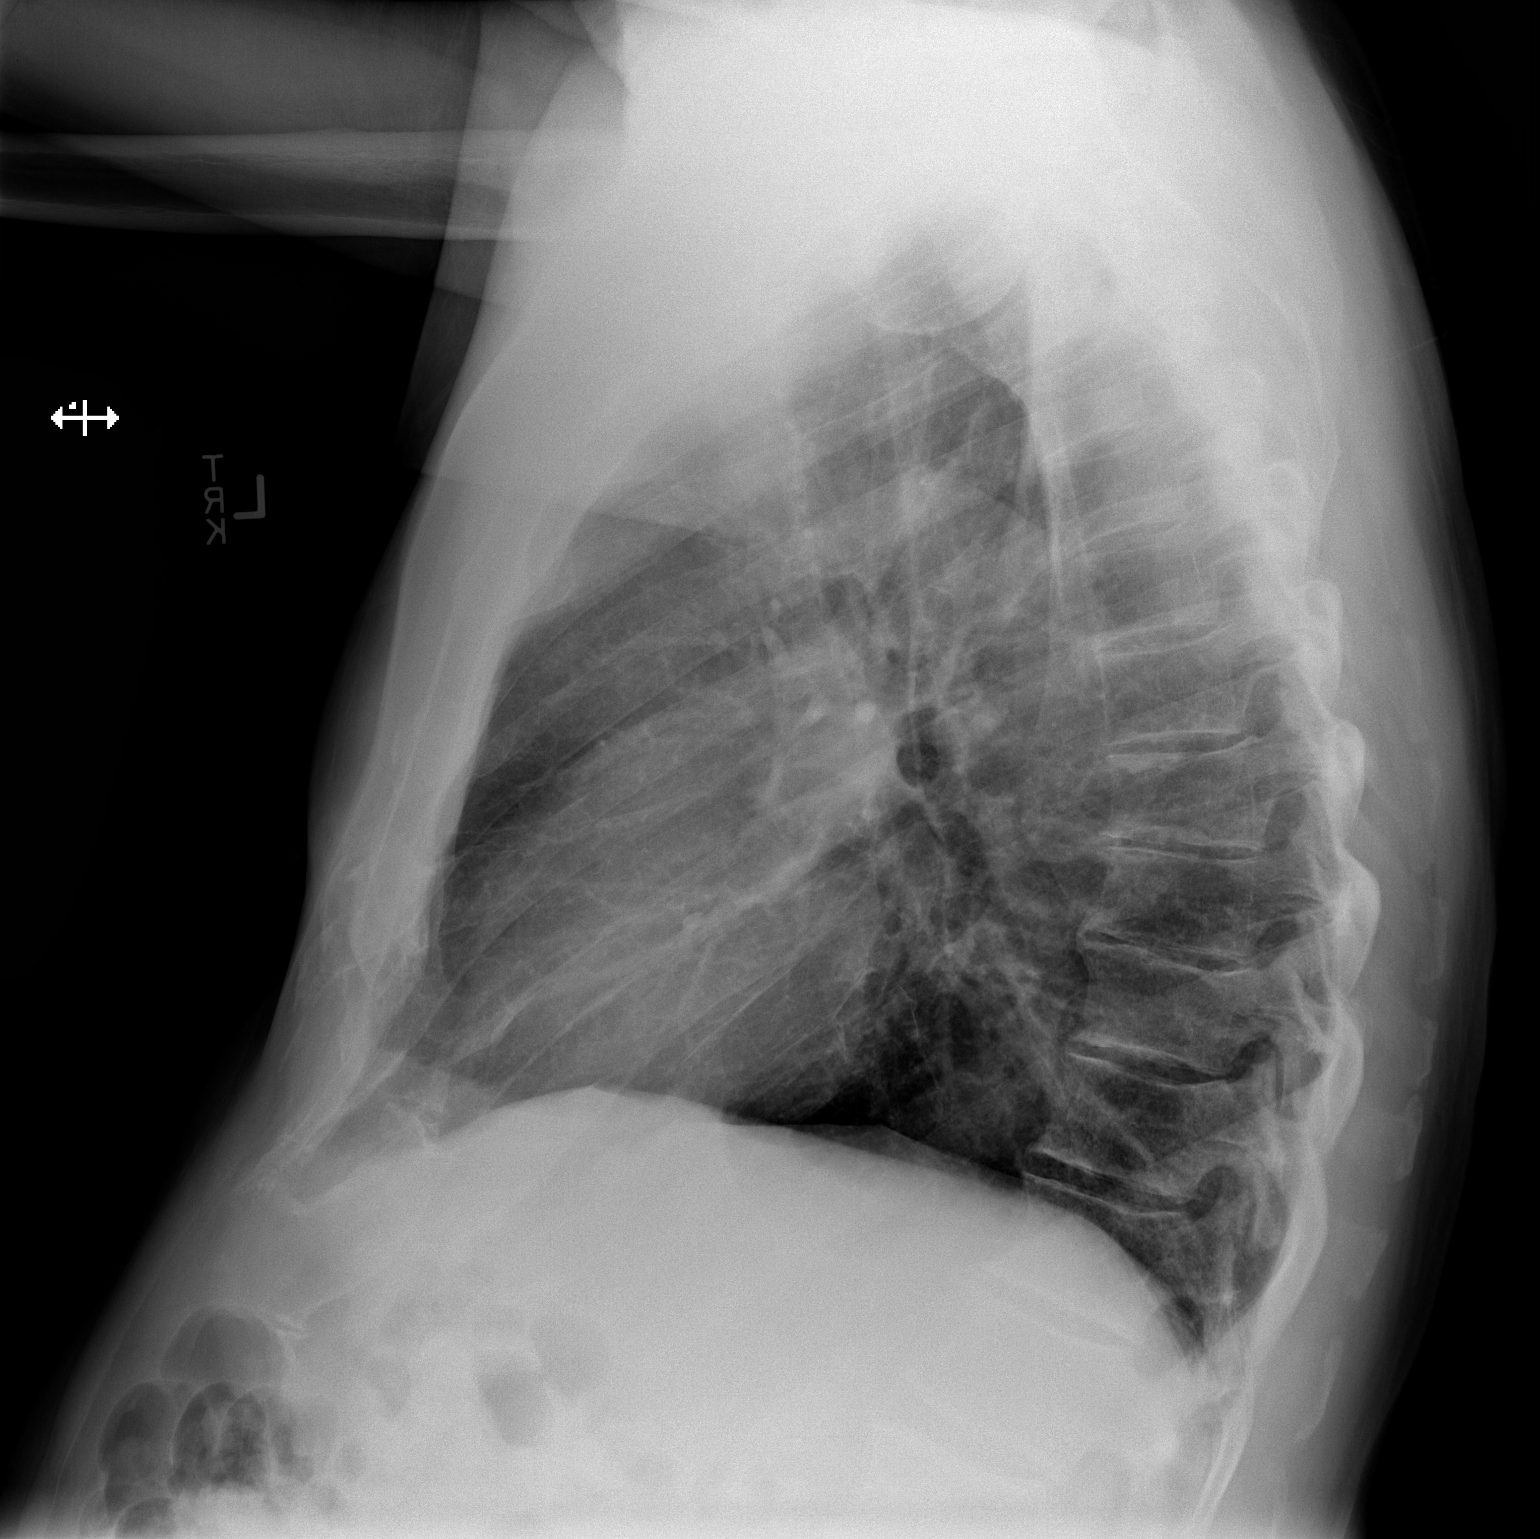

[2 of 2 positions shown; findings below may reference images not displayed]

FINDINGS: The heart size and mediastinal contours are within normal limits.
Both lungs are clear. The visualized skeletal structures are
unremarkable.
IMPRESSION: No active disease.

## 2015-12-25 DIAGNOSIS — G4700 Insomnia, unspecified: Secondary | ICD-10-CM | POA: Diagnosis not present

## 2015-12-25 DIAGNOSIS — J309 Allergic rhinitis, unspecified: Secondary | ICD-10-CM | POA: Diagnosis not present

## 2015-12-25 DIAGNOSIS — E663 Overweight: Secondary | ICD-10-CM | POA: Diagnosis not present

## 2015-12-25 DIAGNOSIS — K219 Gastro-esophageal reflux disease without esophagitis: Secondary | ICD-10-CM | POA: Diagnosis not present

## 2015-12-25 DIAGNOSIS — Z6828 Body mass index (BMI) 28.0-28.9, adult: Secondary | ICD-10-CM | POA: Diagnosis not present

## 2015-12-25 DIAGNOSIS — F419 Anxiety disorder, unspecified: Secondary | ICD-10-CM | POA: Diagnosis not present

## 2015-12-25 DIAGNOSIS — N529 Male erectile dysfunction, unspecified: Secondary | ICD-10-CM | POA: Diagnosis not present

## 2015-12-25 DIAGNOSIS — I10 Essential (primary) hypertension: Secondary | ICD-10-CM | POA: Diagnosis not present

## 2015-12-25 DIAGNOSIS — N400 Enlarged prostate without lower urinary tract symptoms: Secondary | ICD-10-CM | POA: Diagnosis not present

## 2015-12-25 DIAGNOSIS — E039 Hypothyroidism, unspecified: Secondary | ICD-10-CM | POA: Diagnosis not present

## 2016-02-17 DIAGNOSIS — Z23 Encounter for immunization: Secondary | ICD-10-CM | POA: Diagnosis not present

## 2016-02-20 DIAGNOSIS — Z961 Presence of intraocular lens: Secondary | ICD-10-CM | POA: Diagnosis not present

## 2016-02-20 DIAGNOSIS — Z9889 Other specified postprocedural states: Secondary | ICD-10-CM | POA: Diagnosis not present

## 2016-02-20 DIAGNOSIS — H2511 Age-related nuclear cataract, right eye: Secondary | ICD-10-CM | POA: Diagnosis not present

## 2016-02-20 DIAGNOSIS — H33311 Horseshoe tear of retina without detachment, right eye: Secondary | ICD-10-CM | POA: Diagnosis not present

## 2016-02-20 DIAGNOSIS — Z8669 Personal history of other diseases of the nervous system and sense organs: Secondary | ICD-10-CM | POA: Diagnosis not present

## 2016-02-20 DIAGNOSIS — H40013 Open angle with borderline findings, low risk, bilateral: Secondary | ICD-10-CM | POA: Diagnosis not present

## 2016-03-12 DIAGNOSIS — Z96652 Presence of left artificial knee joint: Secondary | ICD-10-CM | POA: Diagnosis not present

## 2016-03-12 DIAGNOSIS — Z471 Aftercare following joint replacement surgery: Secondary | ICD-10-CM | POA: Diagnosis not present

## 2016-06-01 DIAGNOSIS — Z961 Presence of intraocular lens: Secondary | ICD-10-CM | POA: Diagnosis not present

## 2016-06-01 DIAGNOSIS — H2511 Age-related nuclear cataract, right eye: Secondary | ICD-10-CM | POA: Diagnosis not present

## 2016-06-01 DIAGNOSIS — H5203 Hypermetropia, bilateral: Secondary | ICD-10-CM | POA: Diagnosis not present

## 2016-06-01 DIAGNOSIS — H524 Presbyopia: Secondary | ICD-10-CM | POA: Diagnosis not present

## 2016-06-01 DIAGNOSIS — H40013 Open angle with borderline findings, low risk, bilateral: Secondary | ICD-10-CM | POA: Diagnosis not present

## 2016-06-01 DIAGNOSIS — H52203 Unspecified astigmatism, bilateral: Secondary | ICD-10-CM | POA: Diagnosis not present

## 2016-10-26 DIAGNOSIS — N529 Male erectile dysfunction, unspecified: Secondary | ICD-10-CM | POA: Diagnosis not present

## 2016-10-26 DIAGNOSIS — I10 Essential (primary) hypertension: Secondary | ICD-10-CM | POA: Diagnosis not present

## 2016-10-26 DIAGNOSIS — Z6827 Body mass index (BMI) 27.0-27.9, adult: Secondary | ICD-10-CM | POA: Diagnosis not present

## 2016-10-26 DIAGNOSIS — J309 Allergic rhinitis, unspecified: Secondary | ICD-10-CM | POA: Diagnosis not present

## 2016-10-26 DIAGNOSIS — F419 Anxiety disorder, unspecified: Secondary | ICD-10-CM | POA: Diagnosis not present

## 2016-10-26 DIAGNOSIS — N400 Enlarged prostate without lower urinary tract symptoms: Secondary | ICD-10-CM | POA: Diagnosis not present

## 2016-10-26 DIAGNOSIS — E039 Hypothyroidism, unspecified: Secondary | ICD-10-CM | POA: Diagnosis not present

## 2016-10-26 DIAGNOSIS — G4700 Insomnia, unspecified: Secondary | ICD-10-CM | POA: Diagnosis not present

## 2016-10-26 DIAGNOSIS — M545 Low back pain: Secondary | ICD-10-CM | POA: Diagnosis not present

## 2017-02-18 DIAGNOSIS — K219 Gastro-esophageal reflux disease without esophagitis: Secondary | ICD-10-CM | POA: Diagnosis not present

## 2017-02-18 DIAGNOSIS — F419 Anxiety disorder, unspecified: Secondary | ICD-10-CM | POA: Diagnosis not present

## 2017-02-18 DIAGNOSIS — Z6827 Body mass index (BMI) 27.0-27.9, adult: Secondary | ICD-10-CM | POA: Diagnosis not present

## 2017-02-18 DIAGNOSIS — Z23 Encounter for immunization: Secondary | ICD-10-CM | POA: Diagnosis not present

## 2017-02-18 DIAGNOSIS — N400 Enlarged prostate without lower urinary tract symptoms: Secondary | ICD-10-CM | POA: Diagnosis not present

## 2017-02-18 DIAGNOSIS — G4700 Insomnia, unspecified: Secondary | ICD-10-CM | POA: Diagnosis not present

## 2017-02-18 DIAGNOSIS — E039 Hypothyroidism, unspecified: Secondary | ICD-10-CM | POA: Diagnosis not present

## 2017-02-18 DIAGNOSIS — R972 Elevated prostate specific antigen [PSA]: Secondary | ICD-10-CM | POA: Diagnosis not present

## 2017-02-18 DIAGNOSIS — J309 Allergic rhinitis, unspecified: Secondary | ICD-10-CM | POA: Diagnosis not present

## 2017-02-18 DIAGNOSIS — I10 Essential (primary) hypertension: Secondary | ICD-10-CM | POA: Diagnosis not present

## 2017-02-18 DIAGNOSIS — N529 Male erectile dysfunction, unspecified: Secondary | ICD-10-CM | POA: Diagnosis not present

## 2017-02-18 DIAGNOSIS — M545 Low back pain: Secondary | ICD-10-CM | POA: Diagnosis not present

## 2017-02-23 DIAGNOSIS — N400 Enlarged prostate without lower urinary tract symptoms: Secondary | ICD-10-CM | POA: Diagnosis not present

## 2017-02-23 DIAGNOSIS — J309 Allergic rhinitis, unspecified: Secondary | ICD-10-CM | POA: Diagnosis not present

## 2017-02-23 DIAGNOSIS — Z9181 History of falling: Secondary | ICD-10-CM | POA: Diagnosis not present

## 2017-02-23 DIAGNOSIS — F419 Anxiety disorder, unspecified: Secondary | ICD-10-CM | POA: Diagnosis not present

## 2017-02-23 DIAGNOSIS — Z6827 Body mass index (BMI) 27.0-27.9, adult: Secondary | ICD-10-CM | POA: Diagnosis not present

## 2017-02-23 DIAGNOSIS — Z1389 Encounter for screening for other disorder: Secondary | ICD-10-CM | POA: Diagnosis not present

## 2017-02-23 DIAGNOSIS — R972 Elevated prostate specific antigen [PSA]: Secondary | ICD-10-CM | POA: Diagnosis not present

## 2017-02-23 DIAGNOSIS — G4700 Insomnia, unspecified: Secondary | ICD-10-CM | POA: Diagnosis not present

## 2017-02-23 DIAGNOSIS — N529 Male erectile dysfunction, unspecified: Secondary | ICD-10-CM | POA: Diagnosis not present

## 2017-02-23 DIAGNOSIS — J208 Acute bronchitis due to other specified organisms: Secondary | ICD-10-CM | POA: Diagnosis not present

## 2017-02-23 DIAGNOSIS — E663 Overweight: Secondary | ICD-10-CM | POA: Diagnosis not present

## 2017-02-23 DIAGNOSIS — M545 Low back pain: Secondary | ICD-10-CM | POA: Diagnosis not present

## 2017-02-25 DIAGNOSIS — R972 Elevated prostate specific antigen [PSA]: Secondary | ICD-10-CM | POA: Diagnosis not present

## 2017-02-25 DIAGNOSIS — N401 Enlarged prostate with lower urinary tract symptoms: Secondary | ICD-10-CM | POA: Diagnosis not present

## 2017-03-31 DIAGNOSIS — N401 Enlarged prostate with lower urinary tract symptoms: Secondary | ICD-10-CM | POA: Diagnosis not present

## 2017-03-31 DIAGNOSIS — N529 Male erectile dysfunction, unspecified: Secondary | ICD-10-CM | POA: Diagnosis not present

## 2017-03-31 DIAGNOSIS — R972 Elevated prostate specific antigen [PSA]: Secondary | ICD-10-CM | POA: Diagnosis not present

## 2017-04-15 DIAGNOSIS — H33311 Horseshoe tear of retina without detachment, right eye: Secondary | ICD-10-CM | POA: Diagnosis not present

## 2017-04-15 DIAGNOSIS — Z9889 Other specified postprocedural states: Secondary | ICD-10-CM | POA: Diagnosis not present

## 2017-04-15 DIAGNOSIS — H40013 Open angle with borderline findings, low risk, bilateral: Secondary | ICD-10-CM | POA: Diagnosis not present

## 2017-04-15 DIAGNOSIS — Z8669 Personal history of other diseases of the nervous system and sense organs: Secondary | ICD-10-CM | POA: Diagnosis not present

## 2017-04-15 DIAGNOSIS — H2511 Age-related nuclear cataract, right eye: Secondary | ICD-10-CM | POA: Diagnosis not present

## 2017-04-15 DIAGNOSIS — Z961 Presence of intraocular lens: Secondary | ICD-10-CM | POA: Diagnosis not present

## 2017-04-22 DIAGNOSIS — N400 Enlarged prostate without lower urinary tract symptoms: Secondary | ICD-10-CM | POA: Diagnosis not present

## 2017-04-22 DIAGNOSIS — R972 Elevated prostate specific antigen [PSA]: Secondary | ICD-10-CM | POA: Diagnosis not present

## 2017-04-22 DIAGNOSIS — Z6827 Body mass index (BMI) 27.0-27.9, adult: Secondary | ICD-10-CM | POA: Diagnosis not present

## 2017-04-22 DIAGNOSIS — M545 Low back pain: Secondary | ICD-10-CM | POA: Diagnosis not present

## 2017-04-22 DIAGNOSIS — N529 Male erectile dysfunction, unspecified: Secondary | ICD-10-CM | POA: Diagnosis not present

## 2017-04-22 DIAGNOSIS — E039 Hypothyroidism, unspecified: Secondary | ICD-10-CM | POA: Diagnosis not present

## 2017-04-22 DIAGNOSIS — J309 Allergic rhinitis, unspecified: Secondary | ICD-10-CM | POA: Diagnosis not present

## 2017-04-22 DIAGNOSIS — I10 Essential (primary) hypertension: Secondary | ICD-10-CM | POA: Diagnosis not present

## 2017-04-22 DIAGNOSIS — F419 Anxiety disorder, unspecified: Secondary | ICD-10-CM | POA: Diagnosis not present

## 2017-04-22 DIAGNOSIS — G4700 Insomnia, unspecified: Secondary | ICD-10-CM | POA: Diagnosis not present

## 2017-04-22 DIAGNOSIS — K219 Gastro-esophageal reflux disease without esophagitis: Secondary | ICD-10-CM | POA: Diagnosis not present

## 2017-05-17 DIAGNOSIS — N401 Enlarged prostate with lower urinary tract symptoms: Secondary | ICD-10-CM | POA: Diagnosis not present

## 2017-05-17 DIAGNOSIS — R972 Elevated prostate specific antigen [PSA]: Secondary | ICD-10-CM | POA: Diagnosis not present

## 2017-05-17 DIAGNOSIS — N529 Male erectile dysfunction, unspecified: Secondary | ICD-10-CM | POA: Diagnosis not present

## 2017-06-14 DIAGNOSIS — H40013 Open angle with borderline findings, low risk, bilateral: Secondary | ICD-10-CM | POA: Diagnosis not present

## 2017-06-14 DIAGNOSIS — Z961 Presence of intraocular lens: Secondary | ICD-10-CM | POA: Diagnosis not present

## 2017-06-14 DIAGNOSIS — H2511 Age-related nuclear cataract, right eye: Secondary | ICD-10-CM | POA: Diagnosis not present

## 2017-06-14 DIAGNOSIS — Z9889 Other specified postprocedural states: Secondary | ICD-10-CM | POA: Diagnosis not present

## 2017-07-05 DIAGNOSIS — G4700 Insomnia, unspecified: Secondary | ICD-10-CM | POA: Diagnosis not present

## 2017-07-05 DIAGNOSIS — E039 Hypothyroidism, unspecified: Secondary | ICD-10-CM | POA: Diagnosis not present

## 2017-07-05 DIAGNOSIS — Z6827 Body mass index (BMI) 27.0-27.9, adult: Secondary | ICD-10-CM | POA: Diagnosis not present

## 2017-07-05 DIAGNOSIS — J309 Allergic rhinitis, unspecified: Secondary | ICD-10-CM | POA: Diagnosis not present

## 2017-07-05 DIAGNOSIS — F419 Anxiety disorder, unspecified: Secondary | ICD-10-CM | POA: Diagnosis not present

## 2017-07-05 DIAGNOSIS — R972 Elevated prostate specific antigen [PSA]: Secondary | ICD-10-CM | POA: Diagnosis not present

## 2017-07-05 DIAGNOSIS — I10 Essential (primary) hypertension: Secondary | ICD-10-CM | POA: Diagnosis not present

## 2017-07-05 DIAGNOSIS — M545 Low back pain: Secondary | ICD-10-CM | POA: Diagnosis not present

## 2017-07-05 DIAGNOSIS — N529 Male erectile dysfunction, unspecified: Secondary | ICD-10-CM | POA: Diagnosis not present

## 2017-07-05 DIAGNOSIS — N400 Enlarged prostate without lower urinary tract symptoms: Secondary | ICD-10-CM | POA: Diagnosis not present

## 2017-07-08 DIAGNOSIS — N401 Enlarged prostate with lower urinary tract symptoms: Secondary | ICD-10-CM | POA: Diagnosis not present

## 2017-07-08 DIAGNOSIS — N529 Male erectile dysfunction, unspecified: Secondary | ICD-10-CM | POA: Diagnosis not present

## 2017-07-08 DIAGNOSIS — N486 Induration penis plastica: Secondary | ICD-10-CM | POA: Diagnosis not present

## 2017-08-05 DIAGNOSIS — N529 Male erectile dysfunction, unspecified: Secondary | ICD-10-CM | POA: Diagnosis not present

## 2017-08-05 DIAGNOSIS — J0100 Acute maxillary sinusitis, unspecified: Secondary | ICD-10-CM | POA: Diagnosis not present

## 2017-08-05 DIAGNOSIS — J309 Allergic rhinitis, unspecified: Secondary | ICD-10-CM | POA: Diagnosis not present

## 2017-08-05 DIAGNOSIS — N400 Enlarged prostate without lower urinary tract symptoms: Secondary | ICD-10-CM | POA: Diagnosis not present

## 2017-08-05 DIAGNOSIS — R972 Elevated prostate specific antigen [PSA]: Secondary | ICD-10-CM | POA: Diagnosis not present

## 2017-08-05 DIAGNOSIS — M545 Low back pain: Secondary | ICD-10-CM | POA: Diagnosis not present

## 2017-08-05 DIAGNOSIS — F419 Anxiety disorder, unspecified: Secondary | ICD-10-CM | POA: Diagnosis not present

## 2017-08-05 DIAGNOSIS — E663 Overweight: Secondary | ICD-10-CM | POA: Diagnosis not present

## 2017-08-05 DIAGNOSIS — E039 Hypothyroidism, unspecified: Secondary | ICD-10-CM | POA: Diagnosis not present

## 2017-08-05 DIAGNOSIS — G4700 Insomnia, unspecified: Secondary | ICD-10-CM | POA: Diagnosis not present

## 2017-08-05 DIAGNOSIS — I10 Essential (primary) hypertension: Secondary | ICD-10-CM | POA: Diagnosis not present

## 2017-08-05 DIAGNOSIS — Z6827 Body mass index (BMI) 27.0-27.9, adult: Secondary | ICD-10-CM | POA: Diagnosis not present

## 2017-11-16 DIAGNOSIS — E039 Hypothyroidism, unspecified: Secondary | ICD-10-CM | POA: Diagnosis not present

## 2017-11-16 DIAGNOSIS — F419 Anxiety disorder, unspecified: Secondary | ICD-10-CM | POA: Diagnosis not present

## 2017-11-16 DIAGNOSIS — C4441 Basal cell carcinoma of skin of scalp and neck: Secondary | ICD-10-CM | POA: Diagnosis not present

## 2017-11-16 DIAGNOSIS — R972 Elevated prostate specific antigen [PSA]: Secondary | ICD-10-CM | POA: Diagnosis not present

## 2017-11-16 DIAGNOSIS — D224 Melanocytic nevi of scalp and neck: Secondary | ICD-10-CM | POA: Diagnosis not present

## 2017-11-16 DIAGNOSIS — Z1339 Encounter for screening examination for other mental health and behavioral disorders: Secondary | ICD-10-CM | POA: Diagnosis not present

## 2017-11-16 DIAGNOSIS — L820 Inflamed seborrheic keratosis: Secondary | ICD-10-CM | POA: Diagnosis not present

## 2017-11-16 DIAGNOSIS — D2239 Melanocytic nevi of other parts of face: Secondary | ICD-10-CM | POA: Diagnosis not present

## 2017-11-16 DIAGNOSIS — I10 Essential (primary) hypertension: Secondary | ICD-10-CM | POA: Diagnosis not present

## 2017-11-16 DIAGNOSIS — C44519 Basal cell carcinoma of skin of other part of trunk: Secondary | ICD-10-CM | POA: Diagnosis not present

## 2017-11-16 DIAGNOSIS — G4700 Insomnia, unspecified: Secondary | ICD-10-CM | POA: Diagnosis not present

## 2018-02-09 DIAGNOSIS — N401 Enlarged prostate with lower urinary tract symptoms: Secondary | ICD-10-CM | POA: Diagnosis not present

## 2018-02-09 DIAGNOSIS — Z125 Encounter for screening for malignant neoplasm of prostate: Secondary | ICD-10-CM | POA: Diagnosis not present

## 2018-02-09 DIAGNOSIS — N529 Male erectile dysfunction, unspecified: Secondary | ICD-10-CM | POA: Diagnosis not present

## 2018-02-09 DIAGNOSIS — Z0000 Encounter for general adult medical examination without abnormal findings: Secondary | ICD-10-CM | POA: Diagnosis not present

## 2018-02-09 DIAGNOSIS — N486 Induration penis plastica: Secondary | ICD-10-CM | POA: Diagnosis not present

## 2018-02-09 DIAGNOSIS — Z23 Encounter for immunization: Secondary | ICD-10-CM | POA: Diagnosis not present

## 2018-02-09 DIAGNOSIS — R972 Elevated prostate specific antigen [PSA]: Secondary | ICD-10-CM | POA: Diagnosis not present

## 2018-03-03 DIAGNOSIS — N529 Male erectile dysfunction, unspecified: Secondary | ICD-10-CM | POA: Diagnosis not present

## 2018-04-18 DIAGNOSIS — F419 Anxiety disorder, unspecified: Secondary | ICD-10-CM | POA: Diagnosis not present

## 2018-04-18 DIAGNOSIS — R197 Diarrhea, unspecified: Secondary | ICD-10-CM | POA: Diagnosis not present

## 2018-04-18 DIAGNOSIS — R972 Elevated prostate specific antigen [PSA]: Secondary | ICD-10-CM | POA: Diagnosis not present

## 2018-04-18 DIAGNOSIS — E039 Hypothyroidism, unspecified: Secondary | ICD-10-CM | POA: Diagnosis not present

## 2018-04-19 DIAGNOSIS — R197 Diarrhea, unspecified: Secondary | ICD-10-CM | POA: Diagnosis not present

## 2018-05-17 DIAGNOSIS — Z125 Encounter for screening for malignant neoplasm of prostate: Secondary | ICD-10-CM | POA: Diagnosis not present

## 2018-05-17 DIAGNOSIS — F419 Anxiety disorder, unspecified: Secondary | ICD-10-CM | POA: Diagnosis not present

## 2018-05-17 DIAGNOSIS — I10 Essential (primary) hypertension: Secondary | ICD-10-CM | POA: Diagnosis not present

## 2018-05-17 DIAGNOSIS — E039 Hypothyroidism, unspecified: Secondary | ICD-10-CM | POA: Diagnosis not present

## 2018-05-17 DIAGNOSIS — J069 Acute upper respiratory infection, unspecified: Secondary | ICD-10-CM | POA: Diagnosis not present

## 2018-05-17 DIAGNOSIS — G4700 Insomnia, unspecified: Secondary | ICD-10-CM | POA: Diagnosis not present

## 2018-06-27 DIAGNOSIS — H2511 Age-related nuclear cataract, right eye: Secondary | ICD-10-CM | POA: Diagnosis not present

## 2018-06-27 DIAGNOSIS — H5203 Hypermetropia, bilateral: Secondary | ICD-10-CM | POA: Diagnosis not present

## 2018-06-27 DIAGNOSIS — H52203 Unspecified astigmatism, bilateral: Secondary | ICD-10-CM | POA: Diagnosis not present

## 2018-06-27 DIAGNOSIS — H40013 Open angle with borderline findings, low risk, bilateral: Secondary | ICD-10-CM | POA: Diagnosis not present

## 2018-06-27 DIAGNOSIS — Z961 Presence of intraocular lens: Secondary | ICD-10-CM | POA: Diagnosis not present

## 2018-06-27 DIAGNOSIS — H524 Presbyopia: Secondary | ICD-10-CM | POA: Diagnosis not present

## 2018-06-28 DIAGNOSIS — N183 Chronic kidney disease, stage 3 (moderate): Secondary | ICD-10-CM | POA: Diagnosis not present

## 2018-08-18 DIAGNOSIS — N529 Male erectile dysfunction, unspecified: Secondary | ICD-10-CM | POA: Diagnosis not present

## 2018-08-18 DIAGNOSIS — R972 Elevated prostate specific antigen [PSA]: Secondary | ICD-10-CM | POA: Diagnosis not present

## 2018-08-18 DIAGNOSIS — F419 Anxiety disorder, unspecified: Secondary | ICD-10-CM | POA: Diagnosis not present

## 2018-08-18 DIAGNOSIS — G4700 Insomnia, unspecified: Secondary | ICD-10-CM | POA: Diagnosis not present

## 2018-11-17 DIAGNOSIS — Z9181 History of falling: Secondary | ICD-10-CM | POA: Diagnosis not present

## 2018-11-17 DIAGNOSIS — F419 Anxiety disorder, unspecified: Secondary | ICD-10-CM | POA: Diagnosis not present

## 2018-11-17 DIAGNOSIS — I10 Essential (primary) hypertension: Secondary | ICD-10-CM | POA: Diagnosis not present

## 2018-11-17 DIAGNOSIS — Z1331 Encounter for screening for depression: Secondary | ICD-10-CM | POA: Diagnosis not present

## 2018-11-17 DIAGNOSIS — E039 Hypothyroidism, unspecified: Secondary | ICD-10-CM | POA: Diagnosis not present

## 2018-11-17 DIAGNOSIS — R972 Elevated prostate specific antigen [PSA]: Secondary | ICD-10-CM | POA: Diagnosis not present

## 2018-11-17 DIAGNOSIS — Z139 Encounter for screening, unspecified: Secondary | ICD-10-CM | POA: Diagnosis not present

## 2018-11-17 DIAGNOSIS — G4700 Insomnia, unspecified: Secondary | ICD-10-CM | POA: Diagnosis not present

## 2018-12-29 DIAGNOSIS — H40013 Open angle with borderline findings, low risk, bilateral: Secondary | ICD-10-CM | POA: Diagnosis not present

## 2019-01-19 DIAGNOSIS — B353 Tinea pedis: Secondary | ICD-10-CM | POA: Diagnosis not present

## 2019-01-19 DIAGNOSIS — B351 Tinea unguium: Secondary | ICD-10-CM | POA: Diagnosis not present

## 2019-01-19 DIAGNOSIS — L989 Disorder of the skin and subcutaneous tissue, unspecified: Secondary | ICD-10-CM | POA: Diagnosis not present

## 2019-02-10 DIAGNOSIS — G4700 Insomnia, unspecified: Secondary | ICD-10-CM | POA: Diagnosis not present

## 2019-02-10 DIAGNOSIS — R972 Elevated prostate specific antigen [PSA]: Secondary | ICD-10-CM | POA: Diagnosis not present

## 2019-02-10 DIAGNOSIS — I10 Essential (primary) hypertension: Secondary | ICD-10-CM | POA: Diagnosis not present

## 2019-02-10 DIAGNOSIS — Z23 Encounter for immunization: Secondary | ICD-10-CM | POA: Diagnosis not present

## 2019-02-10 DIAGNOSIS — N529 Male erectile dysfunction, unspecified: Secondary | ICD-10-CM | POA: Diagnosis not present

## 2019-03-03 DIAGNOSIS — Z125 Encounter for screening for malignant neoplasm of prostate: Secondary | ICD-10-CM | POA: Diagnosis not present

## 2019-03-03 DIAGNOSIS — Z0000 Encounter for general adult medical examination without abnormal findings: Secondary | ICD-10-CM | POA: Diagnosis not present

## 2019-03-03 DIAGNOSIS — Z1331 Encounter for screening for depression: Secondary | ICD-10-CM | POA: Diagnosis not present

## 2019-03-03 DIAGNOSIS — Z9181 History of falling: Secondary | ICD-10-CM | POA: Diagnosis not present

## 2019-05-15 DIAGNOSIS — R972 Elevated prostate specific antigen [PSA]: Secondary | ICD-10-CM | POA: Diagnosis not present

## 2019-05-15 DIAGNOSIS — F419 Anxiety disorder, unspecified: Secondary | ICD-10-CM | POA: Diagnosis not present

## 2019-05-15 DIAGNOSIS — G4700 Insomnia, unspecified: Secondary | ICD-10-CM | POA: Diagnosis not present

## 2019-05-15 DIAGNOSIS — I10 Essential (primary) hypertension: Secondary | ICD-10-CM | POA: Diagnosis not present

## 2019-05-31 DIAGNOSIS — E039 Hypothyroidism, unspecified: Secondary | ICD-10-CM | POA: Diagnosis not present

## 2019-05-31 DIAGNOSIS — G4700 Insomnia, unspecified: Secondary | ICD-10-CM | POA: Diagnosis not present

## 2019-05-31 DIAGNOSIS — F419 Anxiety disorder, unspecified: Secondary | ICD-10-CM | POA: Diagnosis not present

## 2019-05-31 DIAGNOSIS — R972 Elevated prostate specific antigen [PSA]: Secondary | ICD-10-CM | POA: Diagnosis not present

## 2019-05-31 DIAGNOSIS — I10 Essential (primary) hypertension: Secondary | ICD-10-CM | POA: Diagnosis not present

## 2019-07-05 DIAGNOSIS — H2511 Age-related nuclear cataract, right eye: Secondary | ICD-10-CM | POA: Diagnosis not present

## 2019-07-05 DIAGNOSIS — Z961 Presence of intraocular lens: Secondary | ICD-10-CM | POA: Diagnosis not present

## 2019-07-05 DIAGNOSIS — Z8669 Personal history of other diseases of the nervous system and sense organs: Secondary | ICD-10-CM | POA: Diagnosis not present

## 2019-07-05 DIAGNOSIS — H40013 Open angle with borderline findings, low risk, bilateral: Secondary | ICD-10-CM | POA: Diagnosis not present

## 2019-08-28 DIAGNOSIS — G4700 Insomnia, unspecified: Secondary | ICD-10-CM | POA: Diagnosis not present

## 2019-08-28 DIAGNOSIS — I10 Essential (primary) hypertension: Secondary | ICD-10-CM | POA: Diagnosis not present

## 2019-08-28 DIAGNOSIS — R972 Elevated prostate specific antigen [PSA]: Secondary | ICD-10-CM | POA: Diagnosis not present

## 2019-08-28 DIAGNOSIS — F419 Anxiety disorder, unspecified: Secondary | ICD-10-CM | POA: Diagnosis not present

## 2019-08-28 DIAGNOSIS — N529 Male erectile dysfunction, unspecified: Secondary | ICD-10-CM | POA: Diagnosis not present

## 2019-10-19 DIAGNOSIS — H7191 Unspecified cholesteatoma, right ear: Secondary | ICD-10-CM | POA: Diagnosis not present

## 2019-10-19 DIAGNOSIS — N529 Male erectile dysfunction, unspecified: Secondary | ICD-10-CM | POA: Diagnosis not present

## 2019-10-19 DIAGNOSIS — G4700 Insomnia, unspecified: Secondary | ICD-10-CM | POA: Diagnosis not present

## 2019-10-19 DIAGNOSIS — F419 Anxiety disorder, unspecified: Secondary | ICD-10-CM | POA: Diagnosis not present

## 2019-11-20 DIAGNOSIS — H9190 Unspecified hearing loss, unspecified ear: Secondary | ICD-10-CM | POA: Diagnosis not present

## 2019-11-20 DIAGNOSIS — D164 Benign neoplasm of bones of skull and face: Secondary | ICD-10-CM | POA: Diagnosis not present

## 2019-11-20 DIAGNOSIS — L723 Sebaceous cyst: Secondary | ICD-10-CM | POA: Diagnosis not present

## 2019-11-27 DIAGNOSIS — M545 Low back pain: Secondary | ICD-10-CM | POA: Diagnosis not present

## 2019-11-27 DIAGNOSIS — N400 Enlarged prostate without lower urinary tract symptoms: Secondary | ICD-10-CM | POA: Diagnosis not present

## 2019-11-27 DIAGNOSIS — R972 Elevated prostate specific antigen [PSA]: Secondary | ICD-10-CM | POA: Diagnosis not present

## 2019-11-27 DIAGNOSIS — G4700 Insomnia, unspecified: Secondary | ICD-10-CM | POA: Diagnosis not present

## 2019-11-27 DIAGNOSIS — N529 Male erectile dysfunction, unspecified: Secondary | ICD-10-CM | POA: Diagnosis not present

## 2019-11-27 DIAGNOSIS — Z139 Encounter for screening, unspecified: Secondary | ICD-10-CM | POA: Diagnosis not present

## 2019-11-27 DIAGNOSIS — F419 Anxiety disorder, unspecified: Secondary | ICD-10-CM | POA: Diagnosis not present

## 2019-11-27 DIAGNOSIS — J309 Allergic rhinitis, unspecified: Secondary | ICD-10-CM | POA: Diagnosis not present

## 2019-11-27 DIAGNOSIS — I10 Essential (primary) hypertension: Secondary | ICD-10-CM | POA: Diagnosis not present

## 2019-11-27 DIAGNOSIS — N1831 Chronic kidney disease, stage 3a: Secondary | ICD-10-CM | POA: Diagnosis not present

## 2019-11-27 DIAGNOSIS — E039 Hypothyroidism, unspecified: Secondary | ICD-10-CM | POA: Diagnosis not present

## 2019-11-27 DIAGNOSIS — L0291 Cutaneous abscess, unspecified: Secondary | ICD-10-CM | POA: Diagnosis not present

## 2020-01-01 DIAGNOSIS — L720 Epidermal cyst: Secondary | ICD-10-CM | POA: Diagnosis not present

## 2020-01-01 DIAGNOSIS — D225 Melanocytic nevi of trunk: Secondary | ICD-10-CM | POA: Diagnosis not present

## 2020-01-01 DIAGNOSIS — L723 Sebaceous cyst: Secondary | ICD-10-CM | POA: Diagnosis not present

## 2020-01-23 DIAGNOSIS — L723 Sebaceous cyst: Secondary | ICD-10-CM | POA: Diagnosis not present

## 2020-01-23 DIAGNOSIS — D235 Other benign neoplasm of skin of trunk: Secondary | ICD-10-CM | POA: Diagnosis not present

## 2020-02-20 DIAGNOSIS — N529 Male erectile dysfunction, unspecified: Secondary | ICD-10-CM | POA: Diagnosis not present

## 2020-02-20 DIAGNOSIS — G4700 Insomnia, unspecified: Secondary | ICD-10-CM | POA: Diagnosis not present

## 2020-02-20 DIAGNOSIS — E663 Overweight: Secondary | ICD-10-CM | POA: Diagnosis not present

## 2020-02-20 DIAGNOSIS — M545 Low back pain, unspecified: Secondary | ICD-10-CM | POA: Diagnosis not present

## 2020-02-20 DIAGNOSIS — M25512 Pain in left shoulder: Secondary | ICD-10-CM | POA: Diagnosis not present

## 2020-02-20 DIAGNOSIS — N400 Enlarged prostate without lower urinary tract symptoms: Secondary | ICD-10-CM | POA: Diagnosis not present

## 2020-02-20 DIAGNOSIS — N1831 Chronic kidney disease, stage 3a: Secondary | ICD-10-CM | POA: Diagnosis not present

## 2020-02-20 DIAGNOSIS — F419 Anxiety disorder, unspecified: Secondary | ICD-10-CM | POA: Diagnosis not present

## 2020-02-20 DIAGNOSIS — J309 Allergic rhinitis, unspecified: Secondary | ICD-10-CM | POA: Diagnosis not present

## 2020-02-20 DIAGNOSIS — K219 Gastro-esophageal reflux disease without esophagitis: Secondary | ICD-10-CM | POA: Diagnosis not present

## 2020-02-20 DIAGNOSIS — Z6827 Body mass index (BMI) 27.0-27.9, adult: Secondary | ICD-10-CM | POA: Diagnosis not present

## 2020-03-05 DIAGNOSIS — R972 Elevated prostate specific antigen [PSA]: Secondary | ICD-10-CM | POA: Diagnosis not present

## 2020-03-05 DIAGNOSIS — E663 Overweight: Secondary | ICD-10-CM | POA: Diagnosis not present

## 2020-03-05 DIAGNOSIS — J309 Allergic rhinitis, unspecified: Secondary | ICD-10-CM | POA: Diagnosis not present

## 2020-03-05 DIAGNOSIS — K219 Gastro-esophageal reflux disease without esophagitis: Secondary | ICD-10-CM | POA: Diagnosis not present

## 2020-03-05 DIAGNOSIS — N529 Male erectile dysfunction, unspecified: Secondary | ICD-10-CM | POA: Diagnosis not present

## 2020-03-05 DIAGNOSIS — Z23 Encounter for immunization: Secondary | ICD-10-CM | POA: Diagnosis not present

## 2020-03-05 DIAGNOSIS — M25512 Pain in left shoulder: Secondary | ICD-10-CM | POA: Diagnosis not present

## 2020-03-05 DIAGNOSIS — G4700 Insomnia, unspecified: Secondary | ICD-10-CM | POA: Diagnosis not present

## 2020-03-05 DIAGNOSIS — F419 Anxiety disorder, unspecified: Secondary | ICD-10-CM | POA: Diagnosis not present

## 2020-03-05 DIAGNOSIS — M545 Low back pain, unspecified: Secondary | ICD-10-CM | POA: Diagnosis not present

## 2020-03-05 DIAGNOSIS — N1831 Chronic kidney disease, stage 3a: Secondary | ICD-10-CM | POA: Diagnosis not present

## 2020-03-05 DIAGNOSIS — N400 Enlarged prostate without lower urinary tract symptoms: Secondary | ICD-10-CM | POA: Diagnosis not present

## 2020-03-19 DIAGNOSIS — N1831 Chronic kidney disease, stage 3a: Secondary | ICD-10-CM | POA: Diagnosis not present

## 2020-03-19 DIAGNOSIS — R972 Elevated prostate specific antigen [PSA]: Secondary | ICD-10-CM | POA: Diagnosis not present

## 2020-03-19 DIAGNOSIS — Z9181 History of falling: Secondary | ICD-10-CM | POA: Diagnosis not present

## 2020-03-19 DIAGNOSIS — K219 Gastro-esophageal reflux disease without esophagitis: Secondary | ICD-10-CM | POA: Diagnosis not present

## 2020-03-19 DIAGNOSIS — N400 Enlarged prostate without lower urinary tract symptoms: Secondary | ICD-10-CM | POA: Diagnosis not present

## 2020-03-19 DIAGNOSIS — Z1331 Encounter for screening for depression: Secondary | ICD-10-CM | POA: Diagnosis not present

## 2020-03-19 DIAGNOSIS — F419 Anxiety disorder, unspecified: Secondary | ICD-10-CM | POA: Diagnosis not present

## 2020-03-19 DIAGNOSIS — M545 Low back pain, unspecified: Secondary | ICD-10-CM | POA: Diagnosis not present

## 2020-03-19 DIAGNOSIS — J309 Allergic rhinitis, unspecified: Secondary | ICD-10-CM | POA: Diagnosis not present

## 2020-03-19 DIAGNOSIS — N529 Male erectile dysfunction, unspecified: Secondary | ICD-10-CM | POA: Diagnosis not present

## 2020-03-19 DIAGNOSIS — I10 Essential (primary) hypertension: Secondary | ICD-10-CM | POA: Diagnosis not present

## 2020-03-19 DIAGNOSIS — G4700 Insomnia, unspecified: Secondary | ICD-10-CM | POA: Diagnosis not present

## 2020-03-21 DIAGNOSIS — H40013 Open angle with borderline findings, low risk, bilateral: Secondary | ICD-10-CM | POA: Diagnosis not present

## 2020-04-19 DIAGNOSIS — H4311 Vitreous hemorrhage, right eye: Secondary | ICD-10-CM | POA: Diagnosis not present

## 2020-04-19 DIAGNOSIS — H2511 Age-related nuclear cataract, right eye: Secondary | ICD-10-CM | POA: Diagnosis not present

## 2020-04-19 DIAGNOSIS — H33311 Horseshoe tear of retina without detachment, right eye: Secondary | ICD-10-CM | POA: Diagnosis not present

## 2020-04-19 DIAGNOSIS — H442C2 Degenerative myopia with retinal detachment, left eye: Secondary | ICD-10-CM | POA: Diagnosis not present

## 2020-04-22 DIAGNOSIS — Z87891 Personal history of nicotine dependence: Secondary | ICD-10-CM | POA: Diagnosis not present

## 2020-04-22 DIAGNOSIS — I10 Essential (primary) hypertension: Secondary | ICD-10-CM | POA: Diagnosis not present

## 2020-04-22 DIAGNOSIS — Z9889 Other specified postprocedural states: Secondary | ICD-10-CM | POA: Diagnosis not present

## 2020-04-22 DIAGNOSIS — H442C2 Degenerative myopia with retinal detachment, left eye: Secondary | ICD-10-CM | POA: Diagnosis not present

## 2020-04-22 DIAGNOSIS — H4311 Vitreous hemorrhage, right eye: Secondary | ICD-10-CM | POA: Diagnosis not present

## 2020-04-22 DIAGNOSIS — H3581 Retinal edema: Secondary | ICD-10-CM | POA: Diagnosis not present

## 2020-04-22 DIAGNOSIS — H2511 Age-related nuclear cataract, right eye: Secondary | ICD-10-CM | POA: Diagnosis not present

## 2020-04-22 DIAGNOSIS — H33311 Horseshoe tear of retina without detachment, right eye: Secondary | ICD-10-CM | POA: Diagnosis not present

## 2020-04-23 DIAGNOSIS — E663 Overweight: Secondary | ICD-10-CM | POA: Diagnosis not present

## 2020-04-23 DIAGNOSIS — R972 Elevated prostate specific antigen [PSA]: Secondary | ICD-10-CM | POA: Diagnosis not present

## 2020-04-23 DIAGNOSIS — K219 Gastro-esophageal reflux disease without esophagitis: Secondary | ICD-10-CM | POA: Diagnosis not present

## 2020-04-23 DIAGNOSIS — G4700 Insomnia, unspecified: Secondary | ICD-10-CM | POA: Diagnosis not present

## 2020-04-23 DIAGNOSIS — I10 Essential (primary) hypertension: Secondary | ICD-10-CM | POA: Diagnosis not present

## 2020-04-23 DIAGNOSIS — N529 Male erectile dysfunction, unspecified: Secondary | ICD-10-CM | POA: Diagnosis not present

## 2020-04-23 DIAGNOSIS — N400 Enlarged prostate without lower urinary tract symptoms: Secondary | ICD-10-CM | POA: Diagnosis not present

## 2020-04-23 DIAGNOSIS — N1831 Chronic kidney disease, stage 3a: Secondary | ICD-10-CM | POA: Diagnosis not present

## 2020-04-23 DIAGNOSIS — M545 Low back pain, unspecified: Secondary | ICD-10-CM | POA: Diagnosis not present

## 2020-04-23 DIAGNOSIS — F419 Anxiety disorder, unspecified: Secondary | ICD-10-CM | POA: Diagnosis not present

## 2020-04-23 DIAGNOSIS — J069 Acute upper respiratory infection, unspecified: Secondary | ICD-10-CM | POA: Diagnosis not present

## 2020-04-23 DIAGNOSIS — J309 Allergic rhinitis, unspecified: Secondary | ICD-10-CM | POA: Diagnosis not present

## 2020-04-30 DIAGNOSIS — H442C2 Degenerative myopia with retinal detachment, left eye: Secondary | ICD-10-CM | POA: Diagnosis not present

## 2020-04-30 DIAGNOSIS — H4311 Vitreous hemorrhage, right eye: Secondary | ICD-10-CM | POA: Diagnosis not present

## 2020-04-30 DIAGNOSIS — H3581 Retinal edema: Secondary | ICD-10-CM | POA: Diagnosis not present

## 2020-04-30 DIAGNOSIS — H2511 Age-related nuclear cataract, right eye: Secondary | ICD-10-CM | POA: Diagnosis not present

## 2020-04-30 DIAGNOSIS — H33311 Horseshoe tear of retina without detachment, right eye: Secondary | ICD-10-CM | POA: Diagnosis not present

## 2020-05-14 DIAGNOSIS — H4311 Vitreous hemorrhage, right eye: Secondary | ICD-10-CM | POA: Diagnosis not present

## 2020-05-14 DIAGNOSIS — H442C2 Degenerative myopia with retinal detachment, left eye: Secondary | ICD-10-CM | POA: Diagnosis not present

## 2020-05-14 DIAGNOSIS — H3581 Retinal edema: Secondary | ICD-10-CM | POA: Diagnosis not present

## 2020-05-14 DIAGNOSIS — H2511 Age-related nuclear cataract, right eye: Secondary | ICD-10-CM | POA: Diagnosis not present

## 2020-05-29 DIAGNOSIS — Z01818 Encounter for other preprocedural examination: Secondary | ICD-10-CM | POA: Diagnosis not present

## 2020-05-29 DIAGNOSIS — Z86010 Personal history of colonic polyps: Secondary | ICD-10-CM | POA: Diagnosis not present

## 2020-06-04 DIAGNOSIS — Z20822 Contact with and (suspected) exposure to covid-19: Secondary | ICD-10-CM | POA: Diagnosis not present

## 2020-06-04 DIAGNOSIS — H2511 Age-related nuclear cataract, right eye: Secondary | ICD-10-CM | POA: Diagnosis not present

## 2020-06-04 DIAGNOSIS — F419 Anxiety disorder, unspecified: Secondary | ICD-10-CM | POA: Diagnosis not present

## 2020-06-04 DIAGNOSIS — H442C2 Degenerative myopia with retinal detachment, left eye: Secondary | ICD-10-CM | POA: Diagnosis not present

## 2020-06-04 DIAGNOSIS — Z6827 Body mass index (BMI) 27.0-27.9, adult: Secondary | ICD-10-CM | POA: Diagnosis not present

## 2020-06-04 DIAGNOSIS — H4311 Vitreous hemorrhage, right eye: Secondary | ICD-10-CM | POA: Diagnosis not present

## 2020-06-04 DIAGNOSIS — I10 Essential (primary) hypertension: Secondary | ICD-10-CM | POA: Diagnosis not present

## 2020-06-04 DIAGNOSIS — H3581 Retinal edema: Secondary | ICD-10-CM | POA: Diagnosis not present

## 2020-06-11 DIAGNOSIS — K5730 Diverticulosis of large intestine without perforation or abscess without bleeding: Secondary | ICD-10-CM | POA: Diagnosis not present

## 2020-06-11 DIAGNOSIS — Z87891 Personal history of nicotine dependence: Secondary | ICD-10-CM | POA: Diagnosis not present

## 2020-06-11 DIAGNOSIS — Z1211 Encounter for screening for malignant neoplasm of colon: Secondary | ICD-10-CM | POA: Diagnosis not present

## 2020-06-11 DIAGNOSIS — K648 Other hemorrhoids: Secondary | ICD-10-CM | POA: Diagnosis not present

## 2020-06-11 DIAGNOSIS — D122 Benign neoplasm of ascending colon: Secondary | ICD-10-CM | POA: Diagnosis not present

## 2020-06-11 DIAGNOSIS — D126 Benign neoplasm of colon, unspecified: Secondary | ICD-10-CM | POA: Diagnosis not present

## 2020-06-11 DIAGNOSIS — Z86010 Personal history of colonic polyps: Secondary | ICD-10-CM | POA: Diagnosis not present

## 2020-06-11 DIAGNOSIS — K635 Polyp of colon: Secondary | ICD-10-CM | POA: Diagnosis not present

## 2020-07-02 DIAGNOSIS — H4311 Vitreous hemorrhage, right eye: Secondary | ICD-10-CM | POA: Diagnosis not present

## 2020-07-02 DIAGNOSIS — H442C2 Degenerative myopia with retinal detachment, left eye: Secondary | ICD-10-CM | POA: Diagnosis not present

## 2020-07-02 DIAGNOSIS — H3581 Retinal edema: Secondary | ICD-10-CM | POA: Diagnosis not present

## 2020-07-02 DIAGNOSIS — H2511 Age-related nuclear cataract, right eye: Secondary | ICD-10-CM | POA: Diagnosis not present

## 2020-07-03 DIAGNOSIS — N400 Enlarged prostate without lower urinary tract symptoms: Secondary | ICD-10-CM | POA: Diagnosis not present

## 2020-07-03 DIAGNOSIS — E663 Overweight: Secondary | ICD-10-CM | POA: Diagnosis not present

## 2020-07-03 DIAGNOSIS — I10 Essential (primary) hypertension: Secondary | ICD-10-CM | POA: Diagnosis not present

## 2020-07-03 DIAGNOSIS — G4700 Insomnia, unspecified: Secondary | ICD-10-CM | POA: Diagnosis not present

## 2020-07-03 DIAGNOSIS — R972 Elevated prostate specific antigen [PSA]: Secondary | ICD-10-CM | POA: Diagnosis not present

## 2020-07-03 DIAGNOSIS — F419 Anxiety disorder, unspecified: Secondary | ICD-10-CM | POA: Diagnosis not present

## 2020-07-03 DIAGNOSIS — N1831 Chronic kidney disease, stage 3a: Secondary | ICD-10-CM | POA: Diagnosis not present

## 2020-07-03 DIAGNOSIS — J309 Allergic rhinitis, unspecified: Secondary | ICD-10-CM | POA: Diagnosis not present

## 2020-07-03 DIAGNOSIS — K219 Gastro-esophageal reflux disease without esophagitis: Secondary | ICD-10-CM | POA: Diagnosis not present

## 2020-07-03 DIAGNOSIS — E039 Hypothyroidism, unspecified: Secondary | ICD-10-CM | POA: Diagnosis not present

## 2020-07-03 DIAGNOSIS — N529 Male erectile dysfunction, unspecified: Secondary | ICD-10-CM | POA: Diagnosis not present

## 2020-07-03 DIAGNOSIS — M545 Low back pain, unspecified: Secondary | ICD-10-CM | POA: Diagnosis not present

## 2020-07-05 DIAGNOSIS — L989 Disorder of the skin and subcutaneous tissue, unspecified: Secondary | ICD-10-CM | POA: Diagnosis not present

## 2020-07-05 DIAGNOSIS — L84 Corns and callosities: Secondary | ICD-10-CM | POA: Diagnosis not present

## 2020-07-05 DIAGNOSIS — M2041 Other hammer toe(s) (acquired), right foot: Secondary | ICD-10-CM | POA: Diagnosis not present

## 2020-08-07 DIAGNOSIS — H4311 Vitreous hemorrhage, right eye: Secondary | ICD-10-CM | POA: Diagnosis not present

## 2020-08-07 DIAGNOSIS — Z961 Presence of intraocular lens: Secondary | ICD-10-CM | POA: Diagnosis not present

## 2020-08-07 DIAGNOSIS — H40009 Preglaucoma, unspecified, unspecified eye: Secondary | ICD-10-CM | POA: Diagnosis not present

## 2020-08-07 DIAGNOSIS — Z8669 Personal history of other diseases of the nervous system and sense organs: Secondary | ICD-10-CM | POA: Diagnosis not present

## 2020-08-07 DIAGNOSIS — Z9889 Other specified postprocedural states: Secondary | ICD-10-CM | POA: Diagnosis not present

## 2020-08-07 DIAGNOSIS — H2511 Age-related nuclear cataract, right eye: Secondary | ICD-10-CM | POA: Diagnosis not present

## 2020-08-19 DIAGNOSIS — H6691 Otitis media, unspecified, right ear: Secondary | ICD-10-CM | POA: Diagnosis not present

## 2020-08-19 DIAGNOSIS — F419 Anxiety disorder, unspecified: Secondary | ICD-10-CM | POA: Diagnosis not present

## 2020-08-19 DIAGNOSIS — J309 Allergic rhinitis, unspecified: Secondary | ICD-10-CM | POA: Diagnosis not present

## 2020-08-19 DIAGNOSIS — N400 Enlarged prostate without lower urinary tract symptoms: Secondary | ICD-10-CM | POA: Diagnosis not present

## 2020-08-19 DIAGNOSIS — G4700 Insomnia, unspecified: Secondary | ICD-10-CM | POA: Diagnosis not present

## 2020-08-19 DIAGNOSIS — R972 Elevated prostate specific antigen [PSA]: Secondary | ICD-10-CM | POA: Diagnosis not present

## 2020-08-19 DIAGNOSIS — H7191 Unspecified cholesteatoma, right ear: Secondary | ICD-10-CM | POA: Diagnosis not present

## 2020-08-19 DIAGNOSIS — E663 Overweight: Secondary | ICD-10-CM | POA: Diagnosis not present

## 2020-08-19 DIAGNOSIS — M545 Low back pain, unspecified: Secondary | ICD-10-CM | POA: Diagnosis not present

## 2020-08-19 DIAGNOSIS — N1831 Chronic kidney disease, stage 3a: Secondary | ICD-10-CM | POA: Diagnosis not present

## 2020-08-19 DIAGNOSIS — K219 Gastro-esophageal reflux disease without esophagitis: Secondary | ICD-10-CM | POA: Diagnosis not present

## 2020-08-19 DIAGNOSIS — N529 Male erectile dysfunction, unspecified: Secondary | ICD-10-CM | POA: Diagnosis not present

## 2020-09-13 DIAGNOSIS — H2511 Age-related nuclear cataract, right eye: Secondary | ICD-10-CM | POA: Diagnosis not present

## 2020-09-20 DIAGNOSIS — H442C2 Degenerative myopia with retinal detachment, left eye: Secondary | ICD-10-CM | POA: Diagnosis not present

## 2020-09-20 DIAGNOSIS — H2511 Age-related nuclear cataract, right eye: Secondary | ICD-10-CM | POA: Diagnosis not present

## 2020-09-20 DIAGNOSIS — H4311 Vitreous hemorrhage, right eye: Secondary | ICD-10-CM | POA: Diagnosis not present

## 2020-09-24 DIAGNOSIS — I10 Essential (primary) hypertension: Secondary | ICD-10-CM | POA: Diagnosis not present

## 2020-09-24 DIAGNOSIS — H25811 Combined forms of age-related cataract, right eye: Secondary | ICD-10-CM | POA: Diagnosis not present

## 2020-09-25 DIAGNOSIS — Z48810 Encounter for surgical aftercare following surgery on the sense organs: Secondary | ICD-10-CM | POA: Diagnosis not present

## 2020-09-25 DIAGNOSIS — Z9889 Other specified postprocedural states: Secondary | ICD-10-CM | POA: Diagnosis not present

## 2020-09-25 DIAGNOSIS — Z7952 Long term (current) use of systemic steroids: Secondary | ICD-10-CM | POA: Diagnosis not present

## 2020-09-25 DIAGNOSIS — Z961 Presence of intraocular lens: Secondary | ICD-10-CM | POA: Diagnosis not present

## 2020-09-25 DIAGNOSIS — H4311 Vitreous hemorrhage, right eye: Secondary | ICD-10-CM | POA: Diagnosis not present

## 2020-09-25 DIAGNOSIS — Z8669 Personal history of other diseases of the nervous system and sense organs: Secondary | ICD-10-CM | POA: Diagnosis not present

## 2020-09-25 DIAGNOSIS — Z79899 Other long term (current) drug therapy: Secondary | ICD-10-CM | POA: Diagnosis not present

## 2020-09-25 DIAGNOSIS — H40003 Preglaucoma, unspecified, bilateral: Secondary | ICD-10-CM | POA: Diagnosis not present

## 2020-09-25 DIAGNOSIS — H33311 Horseshoe tear of retina without detachment, right eye: Secondary | ICD-10-CM | POA: Diagnosis not present

## 2020-10-01 DIAGNOSIS — Z87898 Personal history of other specified conditions: Secondary | ICD-10-CM | POA: Diagnosis not present

## 2020-10-01 DIAGNOSIS — H9190 Unspecified hearing loss, unspecified ear: Secondary | ICD-10-CM | POA: Diagnosis not present

## 2020-10-01 DIAGNOSIS — D164 Benign neoplasm of bones of skull and face: Secondary | ICD-10-CM | POA: Diagnosis not present

## 2020-10-07 DIAGNOSIS — I10 Essential (primary) hypertension: Secondary | ICD-10-CM | POA: Diagnosis not present

## 2020-10-07 DIAGNOSIS — N400 Enlarged prostate without lower urinary tract symptoms: Secondary | ICD-10-CM | POA: Diagnosis not present

## 2020-10-07 DIAGNOSIS — R972 Elevated prostate specific antigen [PSA]: Secondary | ICD-10-CM | POA: Diagnosis not present

## 2020-10-07 DIAGNOSIS — N1831 Chronic kidney disease, stage 3a: Secondary | ICD-10-CM | POA: Diagnosis not present

## 2020-10-07 DIAGNOSIS — J309 Allergic rhinitis, unspecified: Secondary | ICD-10-CM | POA: Diagnosis not present

## 2020-10-07 DIAGNOSIS — K219 Gastro-esophageal reflux disease without esophagitis: Secondary | ICD-10-CM | POA: Diagnosis not present

## 2020-10-07 DIAGNOSIS — N529 Male erectile dysfunction, unspecified: Secondary | ICD-10-CM | POA: Diagnosis not present

## 2020-10-07 DIAGNOSIS — G4700 Insomnia, unspecified: Secondary | ICD-10-CM | POA: Diagnosis not present

## 2020-10-07 DIAGNOSIS — M545 Low back pain, unspecified: Secondary | ICD-10-CM | POA: Diagnosis not present

## 2020-10-07 DIAGNOSIS — E663 Overweight: Secondary | ICD-10-CM | POA: Diagnosis not present

## 2020-10-07 DIAGNOSIS — F419 Anxiety disorder, unspecified: Secondary | ICD-10-CM | POA: Diagnosis not present

## 2020-10-07 DIAGNOSIS — E039 Hypothyroidism, unspecified: Secondary | ICD-10-CM | POA: Diagnosis not present

## 2020-10-29 DIAGNOSIS — L219 Seborrheic dermatitis, unspecified: Secondary | ICD-10-CM | POA: Diagnosis not present

## 2020-10-29 DIAGNOSIS — D1801 Hemangioma of skin and subcutaneous tissue: Secondary | ICD-10-CM | POA: Diagnosis not present

## 2020-10-29 DIAGNOSIS — L820 Inflamed seborrheic keratosis: Secondary | ICD-10-CM | POA: Diagnosis not present

## 2020-10-29 DIAGNOSIS — D485 Neoplasm of uncertain behavior of skin: Secondary | ICD-10-CM | POA: Diagnosis not present

## 2020-10-31 DIAGNOSIS — D0362 Melanoma in situ of left upper limb, including shoulder: Secondary | ICD-10-CM | POA: Diagnosis not present

## 2020-11-14 DIAGNOSIS — J309 Allergic rhinitis, unspecified: Secondary | ICD-10-CM | POA: Diagnosis not present

## 2020-11-14 DIAGNOSIS — Z6827 Body mass index (BMI) 27.0-27.9, adult: Secondary | ICD-10-CM | POA: Diagnosis not present

## 2020-11-14 DIAGNOSIS — N1831 Chronic kidney disease, stage 3a: Secondary | ICD-10-CM | POA: Diagnosis not present

## 2020-11-14 DIAGNOSIS — R972 Elevated prostate specific antigen [PSA]: Secondary | ICD-10-CM | POA: Diagnosis not present

## 2020-11-14 DIAGNOSIS — E663 Overweight: Secondary | ICD-10-CM | POA: Diagnosis not present

## 2020-11-14 DIAGNOSIS — N400 Enlarged prostate without lower urinary tract symptoms: Secondary | ICD-10-CM | POA: Diagnosis not present

## 2020-11-14 DIAGNOSIS — F419 Anxiety disorder, unspecified: Secondary | ICD-10-CM | POA: Diagnosis not present

## 2020-11-14 DIAGNOSIS — E039 Hypothyroidism, unspecified: Secondary | ICD-10-CM | POA: Diagnosis not present

## 2020-11-14 DIAGNOSIS — M545 Low back pain, unspecified: Secondary | ICD-10-CM | POA: Diagnosis not present

## 2020-11-14 DIAGNOSIS — N529 Male erectile dysfunction, unspecified: Secondary | ICD-10-CM | POA: Diagnosis not present

## 2020-11-14 DIAGNOSIS — K219 Gastro-esophageal reflux disease without esophagitis: Secondary | ICD-10-CM | POA: Diagnosis not present

## 2020-11-14 DIAGNOSIS — G4700 Insomnia, unspecified: Secondary | ICD-10-CM | POA: Diagnosis not present

## 2020-11-19 DIAGNOSIS — H442C2 Degenerative myopia with retinal detachment, left eye: Secondary | ICD-10-CM | POA: Diagnosis not present

## 2020-11-19 DIAGNOSIS — Z961 Presence of intraocular lens: Secondary | ICD-10-CM | POA: Diagnosis not present

## 2020-11-19 DIAGNOSIS — H4311 Vitreous hemorrhage, right eye: Secondary | ICD-10-CM | POA: Diagnosis not present

## 2020-11-19 DIAGNOSIS — H3581 Retinal edema: Secondary | ICD-10-CM | POA: Diagnosis not present

## 2020-11-19 DIAGNOSIS — Z9841 Cataract extraction status, right eye: Secondary | ICD-10-CM | POA: Diagnosis not present

## 2020-11-26 DIAGNOSIS — D0359 Melanoma in situ of other part of trunk: Secondary | ICD-10-CM | POA: Diagnosis not present

## 2020-11-28 DIAGNOSIS — M67471 Ganglion, right ankle and foot: Secondary | ICD-10-CM | POA: Diagnosis not present

## 2020-12-24 DIAGNOSIS — L570 Actinic keratosis: Secondary | ICD-10-CM | POA: Diagnosis not present

## 2020-12-24 DIAGNOSIS — L578 Other skin changes due to chronic exposure to nonionizing radiation: Secondary | ICD-10-CM | POA: Diagnosis not present

## 2020-12-24 DIAGNOSIS — Z85820 Personal history of malignant melanoma of skin: Secondary | ICD-10-CM | POA: Diagnosis not present

## 2020-12-24 DIAGNOSIS — L821 Other seborrheic keratosis: Secondary | ICD-10-CM | POA: Diagnosis not present

## 2020-12-26 DIAGNOSIS — Z87898 Personal history of other specified conditions: Secondary | ICD-10-CM | POA: Diagnosis not present

## 2020-12-26 DIAGNOSIS — H903 Sensorineural hearing loss, bilateral: Secondary | ICD-10-CM | POA: Diagnosis not present

## 2020-12-26 DIAGNOSIS — D164 Benign neoplasm of bones of skull and face: Secondary | ICD-10-CM | POA: Diagnosis not present

## 2021-01-10 DIAGNOSIS — F419 Anxiety disorder, unspecified: Secondary | ICD-10-CM | POA: Diagnosis not present

## 2021-01-10 DIAGNOSIS — K219 Gastro-esophageal reflux disease without esophagitis: Secondary | ICD-10-CM | POA: Diagnosis not present

## 2021-01-10 DIAGNOSIS — N1831 Chronic kidney disease, stage 3a: Secondary | ICD-10-CM | POA: Diagnosis not present

## 2021-01-10 DIAGNOSIS — E039 Hypothyroidism, unspecified: Secondary | ICD-10-CM | POA: Diagnosis not present

## 2021-01-10 DIAGNOSIS — N400 Enlarged prostate without lower urinary tract symptoms: Secondary | ICD-10-CM | POA: Diagnosis not present

## 2021-01-10 DIAGNOSIS — R972 Elevated prostate specific antigen [PSA]: Secondary | ICD-10-CM | POA: Diagnosis not present

## 2021-01-10 DIAGNOSIS — J309 Allergic rhinitis, unspecified: Secondary | ICD-10-CM | POA: Diagnosis not present

## 2021-01-10 DIAGNOSIS — M545 Low back pain, unspecified: Secondary | ICD-10-CM | POA: Diagnosis not present

## 2021-01-10 DIAGNOSIS — G4700 Insomnia, unspecified: Secondary | ICD-10-CM | POA: Diagnosis not present

## 2021-01-10 DIAGNOSIS — N529 Male erectile dysfunction, unspecified: Secondary | ICD-10-CM | POA: Diagnosis not present

## 2021-01-10 DIAGNOSIS — I10 Essential (primary) hypertension: Secondary | ICD-10-CM | POA: Diagnosis not present

## 2021-01-10 DIAGNOSIS — E663 Overweight: Secondary | ICD-10-CM | POA: Diagnosis not present

## 2021-01-14 DIAGNOSIS — Z9181 History of falling: Secondary | ICD-10-CM | POA: Diagnosis not present

## 2021-01-14 DIAGNOSIS — Z0000 Encounter for general adult medical examination without abnormal findings: Secondary | ICD-10-CM | POA: Diagnosis not present

## 2021-01-14 DIAGNOSIS — Z139 Encounter for screening, unspecified: Secondary | ICD-10-CM | POA: Diagnosis not present

## 2021-01-14 DIAGNOSIS — Z1331 Encounter for screening for depression: Secondary | ICD-10-CM | POA: Diagnosis not present

## 2021-02-21 DIAGNOSIS — Z23 Encounter for immunization: Secondary | ICD-10-CM | POA: Diagnosis not present

## 2021-04-11 DIAGNOSIS — Z1331 Encounter for screening for depression: Secondary | ICD-10-CM | POA: Diagnosis not present

## 2021-04-11 DIAGNOSIS — G3184 Mild cognitive impairment, so stated: Secondary | ICD-10-CM | POA: Diagnosis not present

## 2021-04-11 DIAGNOSIS — I10 Essential (primary) hypertension: Secondary | ICD-10-CM | POA: Diagnosis not present

## 2021-04-11 DIAGNOSIS — N529 Male erectile dysfunction, unspecified: Secondary | ICD-10-CM | POA: Diagnosis not present

## 2021-04-11 DIAGNOSIS — J309 Allergic rhinitis, unspecified: Secondary | ICD-10-CM | POA: Diagnosis not present

## 2021-04-11 DIAGNOSIS — N400 Enlarged prostate without lower urinary tract symptoms: Secondary | ICD-10-CM | POA: Diagnosis not present

## 2021-04-11 DIAGNOSIS — R972 Elevated prostate specific antigen [PSA]: Secondary | ICD-10-CM | POA: Diagnosis not present

## 2021-04-11 DIAGNOSIS — N1831 Chronic kidney disease, stage 3a: Secondary | ICD-10-CM | POA: Diagnosis not present

## 2021-04-11 DIAGNOSIS — M545 Low back pain, unspecified: Secondary | ICD-10-CM | POA: Diagnosis not present

## 2021-04-11 DIAGNOSIS — F419 Anxiety disorder, unspecified: Secondary | ICD-10-CM | POA: Diagnosis not present

## 2021-04-11 DIAGNOSIS — G4700 Insomnia, unspecified: Secondary | ICD-10-CM | POA: Diagnosis not present

## 2021-04-11 DIAGNOSIS — R739 Hyperglycemia, unspecified: Secondary | ICD-10-CM | POA: Diagnosis not present

## 2021-04-23 DIAGNOSIS — S32039A Unspecified fracture of third lumbar vertebra, initial encounter for closed fracture: Secondary | ICD-10-CM | POA: Diagnosis not present

## 2021-04-23 DIAGNOSIS — W19XXXA Unspecified fall, initial encounter: Secondary | ICD-10-CM | POA: Diagnosis not present

## 2021-04-23 DIAGNOSIS — M549 Dorsalgia, unspecified: Secondary | ICD-10-CM | POA: Diagnosis not present

## 2021-04-23 DIAGNOSIS — K760 Fatty (change of) liver, not elsewhere classified: Secondary | ICD-10-CM | POA: Diagnosis not present

## 2021-04-23 DIAGNOSIS — S3991XA Unspecified injury of abdomen, initial encounter: Secondary | ICD-10-CM | POA: Diagnosis not present

## 2021-04-23 DIAGNOSIS — I700 Atherosclerosis of aorta: Secondary | ICD-10-CM | POA: Diagnosis not present

## 2021-04-23 DIAGNOSIS — S299XXA Unspecified injury of thorax, initial encounter: Secondary | ICD-10-CM | POA: Diagnosis not present

## 2021-04-23 DIAGNOSIS — S32049A Unspecified fracture of fourth lumbar vertebra, initial encounter for closed fracture: Secondary | ICD-10-CM | POA: Diagnosis not present

## 2021-04-23 DIAGNOSIS — R609 Edema, unspecified: Secondary | ICD-10-CM | POA: Diagnosis not present

## 2021-04-23 DIAGNOSIS — I2510 Atherosclerotic heart disease of native coronary artery without angina pectoris: Secondary | ICD-10-CM | POA: Diagnosis not present

## 2021-04-23 DIAGNOSIS — I10 Essential (primary) hypertension: Secondary | ICD-10-CM | POA: Diagnosis not present

## 2021-04-23 DIAGNOSIS — S32018A Other fracture of first lumbar vertebra, initial encounter for closed fracture: Secondary | ICD-10-CM | POA: Diagnosis not present

## 2021-04-23 DIAGNOSIS — M545 Low back pain, unspecified: Secondary | ICD-10-CM | POA: Diagnosis not present

## 2021-04-23 DIAGNOSIS — S32029A Unspecified fracture of second lumbar vertebra, initial encounter for closed fracture: Secondary | ICD-10-CM | POA: Diagnosis not present

## 2021-04-23 DIAGNOSIS — K5790 Diverticulosis of intestine, part unspecified, without perforation or abscess without bleeding: Secondary | ICD-10-CM | POA: Diagnosis not present

## 2021-04-23 DIAGNOSIS — S32019A Unspecified fracture of first lumbar vertebra, initial encounter for closed fracture: Secondary | ICD-10-CM | POA: Diagnosis not present

## 2021-04-29 DIAGNOSIS — M545 Low back pain, unspecified: Secondary | ICD-10-CM | POA: Diagnosis not present

## 2021-04-29 DIAGNOSIS — R972 Elevated prostate specific antigen [PSA]: Secondary | ICD-10-CM | POA: Diagnosis not present

## 2021-04-29 DIAGNOSIS — N1831 Chronic kidney disease, stage 3a: Secondary | ICD-10-CM | POA: Diagnosis not present

## 2021-04-29 DIAGNOSIS — J309 Allergic rhinitis, unspecified: Secondary | ICD-10-CM | POA: Diagnosis not present

## 2021-04-29 DIAGNOSIS — R739 Hyperglycemia, unspecified: Secondary | ICD-10-CM | POA: Diagnosis not present

## 2021-04-29 DIAGNOSIS — F419 Anxiety disorder, unspecified: Secondary | ICD-10-CM | POA: Diagnosis not present

## 2021-04-29 DIAGNOSIS — N529 Male erectile dysfunction, unspecified: Secondary | ICD-10-CM | POA: Diagnosis not present

## 2021-04-29 DIAGNOSIS — H1030 Unspecified acute conjunctivitis, unspecified eye: Secondary | ICD-10-CM | POA: Diagnosis not present

## 2021-04-29 DIAGNOSIS — N400 Enlarged prostate without lower urinary tract symptoms: Secondary | ICD-10-CM | POA: Diagnosis not present

## 2021-04-29 DIAGNOSIS — G4700 Insomnia, unspecified: Secondary | ICD-10-CM | POA: Diagnosis not present

## 2021-04-29 DIAGNOSIS — I10 Essential (primary) hypertension: Secondary | ICD-10-CM | POA: Diagnosis not present

## 2021-04-29 DIAGNOSIS — G3184 Mild cognitive impairment, so stated: Secondary | ICD-10-CM | POA: Diagnosis not present

## 2021-05-01 DIAGNOSIS — I10 Essential (primary) hypertension: Secondary | ICD-10-CM | POA: Diagnosis not present

## 2021-05-01 DIAGNOSIS — G4700 Insomnia, unspecified: Secondary | ICD-10-CM | POA: Diagnosis not present

## 2021-05-01 DIAGNOSIS — R972 Elevated prostate specific antigen [PSA]: Secondary | ICD-10-CM | POA: Diagnosis not present

## 2021-05-01 DIAGNOSIS — N1831 Chronic kidney disease, stage 3a: Secondary | ICD-10-CM | POA: Diagnosis not present

## 2021-05-01 DIAGNOSIS — N400 Enlarged prostate without lower urinary tract symptoms: Secondary | ICD-10-CM | POA: Diagnosis not present

## 2021-05-01 DIAGNOSIS — F419 Anxiety disorder, unspecified: Secondary | ICD-10-CM | POA: Diagnosis not present

## 2021-05-01 DIAGNOSIS — G3184 Mild cognitive impairment, so stated: Secondary | ICD-10-CM | POA: Diagnosis not present

## 2021-05-01 DIAGNOSIS — R739 Hyperglycemia, unspecified: Secondary | ICD-10-CM | POA: Diagnosis not present

## 2021-05-01 DIAGNOSIS — J309 Allergic rhinitis, unspecified: Secondary | ICD-10-CM | POA: Diagnosis not present

## 2021-05-01 DIAGNOSIS — N529 Male erectile dysfunction, unspecified: Secondary | ICD-10-CM | POA: Diagnosis not present

## 2021-05-01 DIAGNOSIS — M545 Low back pain, unspecified: Secondary | ICD-10-CM | POA: Diagnosis not present

## 2021-05-01 DIAGNOSIS — J208 Acute bronchitis due to other specified organisms: Secondary | ICD-10-CM | POA: Diagnosis not present

## 2021-05-26 DIAGNOSIS — Z8520 Personal history of malignant neoplasm of unspecified respiratory organ: Secondary | ICD-10-CM | POA: Diagnosis not present

## 2021-05-26 DIAGNOSIS — L570 Actinic keratosis: Secondary | ICD-10-CM | POA: Diagnosis not present

## 2021-05-26 DIAGNOSIS — L821 Other seborrheic keratosis: Secondary | ICD-10-CM | POA: Diagnosis not present

## 2021-05-26 DIAGNOSIS — L7211 Pilar cyst: Secondary | ICD-10-CM | POA: Diagnosis not present

## 2021-06-04 DIAGNOSIS — H26491 Other secondary cataract, right eye: Secondary | ICD-10-CM | POA: Diagnosis not present

## 2021-07-01 DIAGNOSIS — Z9841 Cataract extraction status, right eye: Secondary | ICD-10-CM | POA: Diagnosis not present

## 2021-07-01 DIAGNOSIS — Z961 Presence of intraocular lens: Secondary | ICD-10-CM | POA: Diagnosis not present

## 2021-07-01 DIAGNOSIS — H33311 Horseshoe tear of retina without detachment, right eye: Secondary | ICD-10-CM | POA: Diagnosis not present

## 2021-07-01 DIAGNOSIS — H442C2 Degenerative myopia with retinal detachment, left eye: Secondary | ICD-10-CM | POA: Diagnosis not present

## 2021-07-01 DIAGNOSIS — H4311 Vitreous hemorrhage, right eye: Secondary | ICD-10-CM | POA: Diagnosis not present

## 2021-07-28 DIAGNOSIS — N529 Male erectile dysfunction, unspecified: Secondary | ICD-10-CM | POA: Diagnosis not present

## 2021-07-28 DIAGNOSIS — J309 Allergic rhinitis, unspecified: Secondary | ICD-10-CM | POA: Diagnosis not present

## 2021-07-28 DIAGNOSIS — G3184 Mild cognitive impairment, so stated: Secondary | ICD-10-CM | POA: Diagnosis not present

## 2021-07-28 DIAGNOSIS — G4700 Insomnia, unspecified: Secondary | ICD-10-CM | POA: Diagnosis not present

## 2021-07-28 DIAGNOSIS — K580 Irritable bowel syndrome with diarrhea: Secondary | ICD-10-CM | POA: Diagnosis not present

## 2021-07-28 DIAGNOSIS — Z6827 Body mass index (BMI) 27.0-27.9, adult: Secondary | ICD-10-CM | POA: Diagnosis not present

## 2021-07-28 DIAGNOSIS — R739 Hyperglycemia, unspecified: Secondary | ICD-10-CM | POA: Diagnosis not present

## 2021-07-28 DIAGNOSIS — R972 Elevated prostate specific antigen [PSA]: Secondary | ICD-10-CM | POA: Diagnosis not present

## 2021-07-28 DIAGNOSIS — G4762 Sleep related leg cramps: Secondary | ICD-10-CM | POA: Diagnosis not present

## 2021-07-28 DIAGNOSIS — N3281 Overactive bladder: Secondary | ICD-10-CM | POA: Diagnosis not present

## 2021-07-28 DIAGNOSIS — F419 Anxiety disorder, unspecified: Secondary | ICD-10-CM | POA: Diagnosis not present

## 2021-07-28 DIAGNOSIS — I10 Essential (primary) hypertension: Secondary | ICD-10-CM | POA: Diagnosis not present

## 2021-09-05 DIAGNOSIS — J309 Allergic rhinitis, unspecified: Secondary | ICD-10-CM | POA: Diagnosis not present

## 2021-09-05 DIAGNOSIS — G4700 Insomnia, unspecified: Secondary | ICD-10-CM | POA: Diagnosis not present

## 2021-09-05 DIAGNOSIS — N529 Male erectile dysfunction, unspecified: Secondary | ICD-10-CM | POA: Diagnosis not present

## 2021-09-05 DIAGNOSIS — R972 Elevated prostate specific antigen [PSA]: Secondary | ICD-10-CM | POA: Diagnosis not present

## 2021-09-05 DIAGNOSIS — F419 Anxiety disorder, unspecified: Secondary | ICD-10-CM | POA: Diagnosis not present

## 2021-09-05 DIAGNOSIS — N3281 Overactive bladder: Secondary | ICD-10-CM | POA: Diagnosis not present

## 2021-09-05 DIAGNOSIS — I10 Essential (primary) hypertension: Secondary | ICD-10-CM | POA: Diagnosis not present

## 2021-09-05 DIAGNOSIS — R739 Hyperglycemia, unspecified: Secondary | ICD-10-CM | POA: Diagnosis not present

## 2021-09-05 DIAGNOSIS — G4762 Sleep related leg cramps: Secondary | ICD-10-CM | POA: Diagnosis not present

## 2021-09-05 DIAGNOSIS — K580 Irritable bowel syndrome with diarrhea: Secondary | ICD-10-CM | POA: Diagnosis not present

## 2021-09-05 DIAGNOSIS — G3184 Mild cognitive impairment, so stated: Secondary | ICD-10-CM | POA: Diagnosis not present

## 2021-09-05 DIAGNOSIS — M25511 Pain in right shoulder: Secondary | ICD-10-CM | POA: Diagnosis not present

## 2021-09-19 DIAGNOSIS — I10 Essential (primary) hypertension: Secondary | ICD-10-CM | POA: Diagnosis not present

## 2021-09-19 DIAGNOSIS — R972 Elevated prostate specific antigen [PSA]: Secondary | ICD-10-CM | POA: Diagnosis not present

## 2021-09-19 DIAGNOSIS — Z6827 Body mass index (BMI) 27.0-27.9, adult: Secondary | ICD-10-CM | POA: Diagnosis not present

## 2021-09-19 DIAGNOSIS — N3281 Overactive bladder: Secondary | ICD-10-CM | POA: Diagnosis not present

## 2021-09-19 DIAGNOSIS — K580 Irritable bowel syndrome with diarrhea: Secondary | ICD-10-CM | POA: Diagnosis not present

## 2021-09-19 DIAGNOSIS — E039 Hypothyroidism, unspecified: Secondary | ICD-10-CM | POA: Diagnosis not present

## 2021-09-19 DIAGNOSIS — G3184 Mild cognitive impairment, so stated: Secondary | ICD-10-CM | POA: Diagnosis not present

## 2021-09-19 DIAGNOSIS — G4762 Sleep related leg cramps: Secondary | ICD-10-CM | POA: Diagnosis not present

## 2021-10-27 DIAGNOSIS — L820 Inflamed seborrheic keratosis: Secondary | ICD-10-CM | POA: Diagnosis not present

## 2021-10-27 DIAGNOSIS — L570 Actinic keratosis: Secondary | ICD-10-CM | POA: Diagnosis not present

## 2021-10-27 DIAGNOSIS — D485 Neoplasm of uncertain behavior of skin: Secondary | ICD-10-CM | POA: Diagnosis not present

## 2021-10-27 DIAGNOSIS — R233 Spontaneous ecchymoses: Secondary | ICD-10-CM | POA: Diagnosis not present

## 2021-10-27 DIAGNOSIS — D225 Melanocytic nevi of trunk: Secondary | ICD-10-CM | POA: Diagnosis not present

## 2021-10-27 DIAGNOSIS — D2239 Melanocytic nevi of other parts of face: Secondary | ICD-10-CM | POA: Diagnosis not present

## 2021-12-01 DIAGNOSIS — L570 Actinic keratosis: Secondary | ICD-10-CM | POA: Diagnosis not present

## 2021-12-01 DIAGNOSIS — D485 Neoplasm of uncertain behavior of skin: Secondary | ICD-10-CM | POA: Diagnosis not present

## 2021-12-01 DIAGNOSIS — R233 Spontaneous ecchymoses: Secondary | ICD-10-CM | POA: Diagnosis not present

## 2021-12-01 DIAGNOSIS — C4442 Squamous cell carcinoma of skin of scalp and neck: Secondary | ICD-10-CM | POA: Diagnosis not present

## 2022-02-02 DIAGNOSIS — Z23 Encounter for immunization: Secondary | ICD-10-CM | POA: Diagnosis not present

## 2022-02-18 DIAGNOSIS — F419 Anxiety disorder, unspecified: Secondary | ICD-10-CM | POA: Diagnosis not present

## 2022-02-18 DIAGNOSIS — N3281 Overactive bladder: Secondary | ICD-10-CM | POA: Diagnosis not present

## 2022-02-18 DIAGNOSIS — G3184 Mild cognitive impairment, so stated: Secondary | ICD-10-CM | POA: Diagnosis not present

## 2022-02-18 DIAGNOSIS — I10 Essential (primary) hypertension: Secondary | ICD-10-CM | POA: Diagnosis not present

## 2022-02-18 DIAGNOSIS — G4762 Sleep related leg cramps: Secondary | ICD-10-CM | POA: Diagnosis not present

## 2022-02-18 DIAGNOSIS — R972 Elevated prostate specific antigen [PSA]: Secondary | ICD-10-CM | POA: Diagnosis not present

## 2022-02-18 DIAGNOSIS — U071 COVID-19: Secondary | ICD-10-CM | POA: Diagnosis not present

## 2022-02-18 DIAGNOSIS — K580 Irritable bowel syndrome with diarrhea: Secondary | ICD-10-CM | POA: Diagnosis not present

## 2022-02-18 DIAGNOSIS — J069 Acute upper respiratory infection, unspecified: Secondary | ICD-10-CM | POA: Diagnosis not present

## 2022-02-18 DIAGNOSIS — R5383 Other fatigue: Secondary | ICD-10-CM | POA: Diagnosis not present

## 2022-02-18 DIAGNOSIS — R739 Hyperglycemia, unspecified: Secondary | ICD-10-CM | POA: Diagnosis not present

## 2022-02-18 DIAGNOSIS — E039 Hypothyroidism, unspecified: Secondary | ICD-10-CM | POA: Diagnosis not present

## 2022-04-03 DIAGNOSIS — L299 Pruritus, unspecified: Secondary | ICD-10-CM | POA: Diagnosis not present

## 2022-04-03 DIAGNOSIS — C4442 Squamous cell carcinoma of skin of scalp and neck: Secondary | ICD-10-CM | POA: Diagnosis not present

## 2022-04-03 DIAGNOSIS — Z85820 Personal history of malignant melanoma of skin: Secondary | ICD-10-CM | POA: Diagnosis not present

## 2022-04-03 DIAGNOSIS — D485 Neoplasm of uncertain behavior of skin: Secondary | ICD-10-CM | POA: Diagnosis not present

## 2022-04-03 DIAGNOSIS — L820 Inflamed seborrheic keratosis: Secondary | ICD-10-CM | POA: Diagnosis not present

## 2022-04-03 DIAGNOSIS — L853 Xerosis cutis: Secondary | ICD-10-CM | POA: Diagnosis not present

## 2022-04-03 DIAGNOSIS — L570 Actinic keratosis: Secondary | ICD-10-CM | POA: Diagnosis not present
